# Patient Record
Sex: Female | Born: 1995 | Race: White | Hispanic: No | Marital: Married | Smoking: Never smoker
Health system: Western US, Academic
[De-identification: ages and names within clinical notes are randomized; demographics above are authoritative.]

## PROBLEM LIST (undated history)

## (undated) DIAGNOSIS — Z809 Family history of malignant neoplasm, unspecified: Secondary | ICD-10-CM

## (undated) HISTORY — DX: Family history of malignant neoplasm, unspecified: Z80.9

## (undated) MED ORDER — ESCITALOPRAM OXALATE 5 MG OR TABS
5.0000 mg | ORAL_TABLET | Freq: Every day | ORAL | 1 refills | Status: AC
Start: 2021-10-04 — End: ?

## (undated) MED ORDER — ESCITALOPRAM OXALATE 10 MG OR TABS
10.00 mg | ORAL_TABLET | Freq: Every day | ORAL | 0 refills | Status: AC
Start: 2022-04-12 — End: ?

---

## 2021-07-13 ENCOUNTER — Encounter (INDEPENDENT_AMBULATORY_CARE_PROVIDER_SITE_OTHER): Admitting: Family Medicine

## 2021-07-18 ENCOUNTER — Encounter (INDEPENDENT_AMBULATORY_CARE_PROVIDER_SITE_OTHER): Payer: Self-pay | Admitting: Hospital

## 2021-07-18 ENCOUNTER — Encounter (INDEPENDENT_AMBULATORY_CARE_PROVIDER_SITE_OTHER): Payer: Self-pay | Admitting: Family Medicine

## 2021-07-18 ENCOUNTER — Ambulatory Visit (INDEPENDENT_AMBULATORY_CARE_PROVIDER_SITE_OTHER): Admitting: Family Medicine

## 2021-07-18 VITALS — BP 112/74 | HR 61 | Temp 98.1°F | Resp 16 | Ht 63.0 in | Wt 100.6 lb

## 2021-07-18 MED ORDER — ESCITALOPRAM OXALATE 5 MG OR TABS
5.0000 mg | ORAL_TABLET | Freq: Every day | ORAL | 1 refills | Status: DC
Start: 2021-07-18 — End: 2021-08-10

## 2021-07-18 NOTE — Patient Instructions (Addendum)
 Sign release of records from providers for mammograms and ultrasounds, and MRI of brain. Take forms home and sent mychart copy.     Send a copy of your vaccine record via mychart.    Call the [facility] in 2 weeks if not informed of surgical consults.    Blood work today, if not Schedule 8 hour fasting blood work at [facility] or [facility] [employer]. Please drink plenty of water and bring a snack for afterwards.      Please allow for your Therapy referral two weeks to be authorized. Please call [facility] if you have not heard back. Can also check MyChart. You may contact the [facility] referrals department at [phone] with any questions.

## 2021-07-19 LAB — COMPREHENSIVE METABOLIC PANEL, BLOOD
A/G Ratio: 2.1 (ref 1.2–2.2)
ALT (SGPT): 9 [IU]/L (ref 0–32)
AST: 21 [IU]/L (ref 0–40)
Albumin: 5.1 g/dL — ABNORMAL HIGH (ref 3.9–5.0)
Alkaline Phos: 44 [IU]/L (ref 44–121)
BUN/Creatinine Ratio: 11 (ref 9–23)
BUN: 9 mg/dL (ref 6–20)
Bilirubin, Total: 0.6 mg/dL (ref 0.0–1.2)
Calcium: 10.7 mg/dL — ABNORMAL HIGH (ref 8.7–10.2)
Carbon Dioxide: 22 mmol/L (ref 20–29)
Chloride: 103 mmol/L (ref 96–106)
Creatinine: 0.83 mg/dL (ref 0.57–1.00)
EGFR: 100 mL/min/{1.73_m2} (ref >59)
Globulin, Total: 2.4 g/dL (ref 1.5–4.5)
Glucose: 91 mg/dL (ref 70–99)
Potassium: 4.5 mmol/L (ref 3.5–5.2)
Protein, Total, Serum: 7.5 g/dL (ref 6.0–8.5)
Sodium: 142 mmol/L (ref 134–144)

## 2021-07-19 LAB — CBC WITH DIFF, BLOOD
Baso (Absolute): 0.1 10*3/uL (ref 0.0–0.2)
Basos: 1 %
Eos (Absolute): 0.5 10*3/uL — ABNORMAL HIGH (ref 0.0–0.4)
Eos: 8 %
Hematocrit: 46.2 % (ref 34.0–46.6)
Hemoglobin: 15.6 g/dL (ref 11.1–15.9)
Immature Grans (Abs): 0 10*3/uL (ref 0.0–0.1)
Immature Granulocytes: 0 %
Lymphs (Absolute): 2 10*3/uL (ref 0.7–3.1)
Lymphs: 35 %
MCH: 30.2 pg (ref 26.6–33.0)
MCHC: 33.8 g/dL (ref 31.5–35.7)
MCV: 89 fL (ref 79–97)
Monocytes(Absolute): 0.3 10*3/uL (ref 0.1–0.9)
Monocytes: 5 %
Neutrophils (Absolute): 2.8 10*3/uL (ref 1.4–7.0)
Neutrophils: 51 %
Platelets: 192 10*3/uL (ref 150–450)
RBC: 5.17 x10E6/uL (ref 3.77–5.28)
RDW: 12.2 % (ref 11.7–15.4)
WBC: 5.5 10*3/uL (ref 3.4–10.8)

## 2021-07-19 LAB — LIPID(CHOL FRACT) PANEL, BLOOD
Cholesterol: 158 mg/dL (ref 100–199)
HDL Cholesterol: 77 mg/dL (ref >39)
LDL Chol CAL (NIH) - LABCORP: 68 mg/dL (ref 0–99)
Non-HDL Cholesterol: 81 mg/dL (ref 0–129)
Triglycerides: 65 mg/dL (ref 0–149)
VLDL Cholesterol CAL: 13 mg/dL (ref 5–40)

## 2021-07-19 LAB — THYROID CASCADE: TSH: 1.48 u[IU]/mL (ref 0.450–4.500)

## 2021-07-19 LAB — HEPATITIS C AB, BLOOD: Hep C Virus Ab: NONREACTIVE

## 2021-07-19 LAB — PROLACTIN, BLOOD: Prolactin: 8.1 ng/mL (ref 4.8–23.3)

## 2021-07-20 ENCOUNTER — Encounter (INDEPENDENT_AMBULATORY_CARE_PROVIDER_SITE_OTHER): Payer: Self-pay

## 2021-07-20 ENCOUNTER — Telehealth (INDEPENDENT_AMBULATORY_CARE_PROVIDER_SITE_OTHER): Payer: Self-pay | Admitting: Family Medicine

## 2021-07-24 ENCOUNTER — Ambulatory Visit: Admitting: Mental Health

## 2021-07-24 ENCOUNTER — Encounter (INDEPENDENT_AMBULATORY_CARE_PROVIDER_SITE_OTHER): Payer: Self-pay

## 2021-08-03 ENCOUNTER — Ambulatory Visit (INDEPENDENT_AMBULATORY_CARE_PROVIDER_SITE_OTHER): Admitting: Mental Health

## 2021-08-08 ENCOUNTER — Ambulatory Visit (INDEPENDENT_AMBULATORY_CARE_PROVIDER_SITE_OTHER): Admitting: Family Medicine

## 2021-08-08 ENCOUNTER — Encounter (INDEPENDENT_AMBULATORY_CARE_PROVIDER_SITE_OTHER): Payer: Self-pay | Admitting: Family Medicine

## 2021-08-08 VITALS — BP 118/77 | HR 61 | Temp 98.8°F | Resp 16 | Ht 63.0 in | Wt 99.0 lb

## 2021-08-09 LAB — PTH INTACT, BLOOD: PTH Intact: 31 pg/mL (ref 15–65)

## 2021-08-09 LAB — VITAMIN D, 25-OH TOTAL: Vitamin D, 25-Hydroxy: 42.7 ng/mL (ref 30.0–100.0)

## 2021-08-10 ENCOUNTER — Ambulatory Visit: Admitting: Mental Health

## 2021-08-10 ENCOUNTER — Other Ambulatory Visit (INDEPENDENT_AMBULATORY_CARE_PROVIDER_SITE_OTHER): Payer: Self-pay | Admitting: Family Medicine

## 2021-08-10 MED ORDER — ESCITALOPRAM OXALATE 5 MG OR TABS
5.0000 mg | ORAL_TABLET | Freq: Every day | ORAL | 1 refills | Status: DC
Start: 2021-08-10 — End: 2021-10-04

## 2021-08-14 ENCOUNTER — Telehealth (INDEPENDENT_AMBULATORY_CARE_PROVIDER_SITE_OTHER): Payer: Self-pay | Admitting: Family Medicine

## 2021-08-14 NOTE — Telephone Encounter (Signed)
 Referral note updated. I re faxed the referral to the providers office so they have the most up to date information.

## 2021-08-23 ENCOUNTER — Encounter (INDEPENDENT_AMBULATORY_CARE_PROVIDER_SITE_OTHER): Payer: Self-pay | Admitting: Family Medicine

## 2021-08-24 ENCOUNTER — Ambulatory Visit: Admitting: Mental Health

## 2021-08-25 ENCOUNTER — Encounter (INDEPENDENT_AMBULATORY_CARE_PROVIDER_SITE_OTHER): Payer: Self-pay | Admitting: Family Medicine

## 2021-08-30 ENCOUNTER — Encounter (INDEPENDENT_AMBULATORY_CARE_PROVIDER_SITE_OTHER): Payer: Self-pay | Admitting: Family Medicine

## 2021-08-30 ENCOUNTER — Ambulatory Visit: Admitting: Mental Health

## 2021-08-30 DIAGNOSIS — N63 Unspecified lump in unspecified breast: Secondary | ICD-10-CM

## 2021-08-30 DIAGNOSIS — Z803 Family history of malignant neoplasm of breast: Secondary | ICD-10-CM

## 2021-09-07 ENCOUNTER — Ambulatory Visit: Admitting: Mental Health

## 2021-09-14 ENCOUNTER — Telehealth (HOSPITAL_BASED_OUTPATIENT_CLINIC_OR_DEPARTMENT_OTHER): Payer: Self-pay

## 2021-09-14 NOTE — Telephone Encounter (Signed)
We tried to locate the provider being requested in the tircare portal and the provider did not come up.  I will contact the office to ask if they can confirm if they do in fact take tricare and how thy would be listed in the tricare system.

## 2021-09-14 NOTE — Telephone Encounter (Signed)
Received internal referral   UP:BDHDIX nodule, Family Hx of breast cancer in first degree relative      Indications: breast nodule and family history of breast cancer in 1st degree relative.    Imaging and reports included -- need images and slides    Will schedule in benign clinic with [facility]

## 2021-09-14 NOTE — Telephone Encounter (Signed)
Would I have to call their offices to confirm that Dr. Gracelyn Nurse accepts her insurance?

## 2021-09-18 NOTE — Telephone Encounter (Signed)
Breast MRI denied, however did not include the information regarding IHS's recommendation for MRI. Resubmitted MRI breast and asked referral department to include [place] and IHS letter that recommended MRI. Called referrals and it will be submitted today.

## 2021-09-18 NOTE — Telephone Encounter (Signed)
Called patient will call back to schedule apt. Has my direct number

## 2021-09-18 NOTE — Addendum Note (Signed)
Addended by: Leticia Penna on: 09/18/2021 03:51 PM     Modules accepted: Orders

## 2021-09-21 ENCOUNTER — Ambulatory Visit: Admitting: Mental Health

## 2021-09-21 ENCOUNTER — Encounter (HOSPITAL_BASED_OUTPATIENT_CLINIC_OR_DEPARTMENT_OTHER): Payer: Self-pay

## 2021-09-21 NOTE — Progress Notes (Signed)
Packed Wellness by [facility] Therapy Follow-Up Note      Date of Service: 09/21/2021  Duration: 58 minutes  Patient Location confirmed?: Yes    Patient acknowledged limits of confidentiality, mandated reporting requirements, and limitations of telehealth services.     Presenting Problem/Chief Complaint: stress     SESSION SUMMARY:   Patient is a 26 year old female.     Presentation/Mood:  Pt met with Th for telehealth session. Th checked in with the PT about how she is doing. Pt stated that she has been doing okay.. Th listened to the Pt talk about what has been going well. PT stated that she has still been making progress with the job but it has been slow and frustrating. Th talked with the Pt about how she has been with her mood. Pt stated thatshe is feeling less stressed and just trying to be in the present moment.  No current safety concerns: Patient denies DTS, DTO, SI, SIB, and does not present GD.     During this session Provider reviewed and incorporated established goals and interventions developed during intake.      PRELIMINARY TX GOALS AND INTERVENTIONS:    (Goals and Interventions related to each mental health diagnosis)      Goals:  -Working on changing her emotions and not feel sad as often.  -Working on having a more positive outlook on how the next few years will look like.  -Working on controlling her frustration with her husband for things he cannot control.    Patient Instructions   In the event of an emergency (worsening thoughts to harm self/others, inability to take care of yourself), please call 911 or go to the nearest emergency room.     If you need crisis counseling or referrals you may also call the [place] at [phone]; 24 hours, 7 days a week.      Additional Resources include:   [place] Suicide Hotline: [place] on [place]):1-601 161 7765, 8035899238     For non-urgent issues, you can message me directly via  My Chart or contact the Wellness Department at [phone].          FOLLOW UP   Return in about 1 week (around 09/28/2021).      Visit completed and signed by:    Harlow Mares, LCSW    Electronically Signed By: Harlow Mares LCSW [phone]

## 2021-09-21 NOTE — Patient Instructions (Signed)
In the event of an emergency (worsening thoughts to harm self/others, inability to take care of yourself), please call 911 or go to the nearest emergency room.    If you need crisis counseling or referrals you may also call the [place] Access and Crisis Line at [phone]; 24 hours, 7 days a week.      Additional Resources include:   [place] Suicide Hotline: 1-800-784-2433  National Alliance on Mental Illness ([place]):1-800-523-5933, 619-543-1434    For non-urgent issues, you can message me directly via My Chart or contact the Wellness Department at [phone].

## 2021-09-21 NOTE — Telephone Encounter (Signed)
Confirmed new patient consultation with patient  Has the patient been notified of:  Time of appt- yes  Date of appt- yes  Provider- yes  Location- yes  Visitor policy- yes    Understands Gloris Manchester is a female provider     Will request imaging through e health   Confirmation [phone]

## 2021-09-21 NOTE — Telephone Encounter (Signed)
Called patient left message to call back to confirm ins and member ID number

## 2021-09-22 ENCOUNTER — Other Ambulatory Visit: Payer: Self-pay

## 2021-09-26 ENCOUNTER — Encounter (INDEPENDENT_AMBULATORY_CARE_PROVIDER_SITE_OTHER): Payer: Self-pay | Admitting: Family Medicine

## 2021-09-26 NOTE — Telephone Encounter (Signed)
Secure chat sent to Dr.Damasco.

## 2021-09-26 NOTE — Telephone Encounter (Signed)
From: Debra Lee  To: Laurell Roof Damasco-Gutierrez, MD  Sent: 09/26/2021 10:47 AM PDT  Subject: repeat MRI orders    Good Morning,     I have gotten several calls from [facility] over the past few weeks about scheduling my breast MRI because they are getting new orders for it. I have one scheduled for the end of August so I do not need any more orders placed for breast MRIs.    Thank you,   Debra Lee

## 2021-09-28 ENCOUNTER — Ambulatory Visit (INDEPENDENT_AMBULATORY_CARE_PROVIDER_SITE_OTHER): Admitting: Mental Health

## 2021-10-04 ENCOUNTER — Telehealth: Admitting: Family Medicine

## 2021-10-04 ENCOUNTER — Telehealth (INDEPENDENT_AMBULATORY_CARE_PROVIDER_SITE_OTHER): Payer: Self-pay

## 2021-10-04 ENCOUNTER — Other Ambulatory Visit (INDEPENDENT_AMBULATORY_CARE_PROVIDER_SITE_OTHER): Payer: Self-pay | Admitting: Family Medicine

## 2021-10-04 ENCOUNTER — Encounter (INDEPENDENT_AMBULATORY_CARE_PROVIDER_SITE_OTHER): Payer: Self-pay | Admitting: Family Medicine

## 2021-10-04 DIAGNOSIS — F419 Anxiety disorder, unspecified: Secondary | ICD-10-CM

## 2021-10-04 MED ORDER — ESCITALOPRAM OXALATE 5 MG OR TABS
5.00 mg | ORAL_TABLET | Freq: Every day | ORAL | 3 refills | Status: DC
Start: 2021-10-04 — End: 2022-01-15

## 2021-10-04 NOTE — Progress Notes (Signed)
[facility] Tele-Medicine Note    Subjective   Debra Lee is a 26 year old female who presents to clinic via video. due to COVID-19 pandemic and federally declared state of public health emergency. Patient's identity verified and consent obtained.    HPI 10/04/21 video visit  Cc:  F/u on lexapro  lexapro 5 mg prescribed 08/10/21.   LMP not getting 2nd to Mirena 11/2018.  Seeing therapist .  Lexapro 47m   Gad phq      07/18/2021    10:47 AM 07/24/2021    11:20 AM 10/04/2021     1:37 PM   GAD 7   1. Feeling nervous, anxious or on edge 1 1 0   2. Not being able to stop or control worrying 2 1 0   3. Worrying too much about different things 2 1 0   4. Trouble relaxing 0 0 0   5. Being so restless that it is hard to sit still 0 0 0   6. Being easily annoyed or irritable 1 1 0   7. Feeling afraid as if something awful might happen 0 0 0   GAD7 Patient Total 6 4 0   If you checked off any problems, how difficult have these problems made it for you to do your job along with other people? Somewhat difficult  Not difficult at all          10/04/2021     1:35 PM 07/24/2021    11:18 AM 07/18/2021    10:47 AM   PHQ2 QUESTIONNAIRE   Little interest or pleasure in doing things 0 1 1   Feeling down, depressed, or hopeless 0 2 2   Trouble falling or staying asleep, or sleeping too much _0 Feeling tired or having little energy 0 2 1   Poor appetite or overeating 0 1 0   Feeling bad about yourself--or that you are a failure to have let yourself or your family down 0 2 2   Trouble concentrating on things, such as reading the newspaper or watching television 0 0 0   Moving or speaking so slowly that other people could have noticed.  Or the opposite--being so fidgety or restless that you have been moving around a lot more than usual 0 0 0   Thoughts that you would be better off dead, or of hurting yourself in some way 0 0 0   If you checked off any problems, how difficult have these problems made it for you to do your work, take care  of things at home, or get along with other people? Not difficult at all  Somewhat difficult   PHQ9 Patient Summary Score (calculated) _1 ?MRI of breasts appointment appointment in 2 weeks.     Saw pMedical sales representativeand to see breast surgeon in a few weeks.       Elevated calcium  Nl prolactin an dpth vit d 42.   Not consuming a lot of calcium.  Headaches decreased.  Getting 1 a week or every 2 weeks.  Could be coffee withdrawal.     Most Recent Labs:  Lab Results   Component Value Date    GLU 91 07/18/2021    CREAT 0.83 07/18/2021    EGFR 100 07/18/2021    TSH 1.480 07/18/2021     Lab Results   Component Value Date    AST 21 07/18/2021  ALT 9 07/18/2021     Lab Results   Component Value Date    HGB 15.6 07/18/2021    WBC 5.5 07/18/2021    PLT 192 07/18/2021     Lab Results   Component Value Date    LDLCHOLCALNI 68 07/18/2021    CHOL 158 07/18/2021    TRIG 65 07/18/2021    HDL 77 07/18/2021      Lab Results   Component Value Date    VITD25HYDROX 42.7 08/08/2021         REVIEW OF SYSTEMS      Review of Systems:    See HPI    Outpatient Medications Prior to Visit   Medication Sig Dispense Refill    cetirizine (ZYRTEC) 10 MG chewable tablet Take 1 tablet (10 mg) by mouth daily.      escitalopram (LEXAPRO) 5 MG tablet Take 1 tablet (5 mg) by mouth daily. 30 tablet 1     No facility-administered medications prior to visit.     Immunization History   Administered Date(s) Administered    Tdap 05/20/2017     Allergies   Allergen Reactions    Compazine [Prochlorperazine] Other     .     Pcn [Penicillins] Hives     Patient Active Problem List    Diagnosis Date Noted    Breast nodule 08/08/2021     Had ultrasound 07/2020 2 nodules at right breast.  5 oclock and 10 oclock      FHx: breast cancer in first degree relative 08/08/2021    Migraine without status migrainosus, not intractable, unspecified migraine type 08/08/2021    Anxiety 07/24/2021    MDD (major depressive disorder), recurrent episode, moderate (CMS-HCC)  07/24/2021     Past Medical History:   Diagnosis Date    Anxiety     Major depressive disorder, single episode     Migraine      Past Surgical History:   Procedure Laterality Date    WISDOM TOOTH EXTRACTION  2017     Social History     Socioeconomic History    Marital status: Married     Spouse name: Not on file    Number of children: Not on file    Years of education: Not on file    Highest education level: Not on file   Occupational History    Not on file   Tobacco Use    Smoking status: Never    Smokeless tobacco: Never   Substance and Sexual Activity    Alcohol use: Yes     Comment: socailly    Drug use: Never    Sexual activity: Yes     Partners: Male     Birth control/protection: I.U.D.   Other Topics Concern    Not on file   Social History Narrative    Not on file     Social Determinants of Health     Financial Resource Strain: Not on file   Food Insecurity: Not on file   Transportation Needs: Not on file   Physical Activity: Not on file   Stress: Not on file   Social Connections: Not on file   Intimate Partner Violence: Not on file   Housing Stability: Not on file     Family History   Problem Relation Name Age of Onset    Cancer Mother      Cancer Maternal Grandfather      Other Paternal Grandmother      Other Paternal  Grandfather      Hypertension Paternal Grandfather      Heart Disease Paternal Grandfather       Family Status   Relation Status    Mo (Not Specified)    MGFa (Not Specified)    PGMo (Not Specified)    PGFa (Not Specified)     Objective:  There were no vitals filed for this visit.  There is no height or weight on file to calculate BMI.    Wt Readings from Last 5 Encounters:   08/08/21 (!) 44.9 kg (99 lb)   07/18/21 (!) 45.6 kg (100 lb 9.6 oz)     Blood Pressure   08/08/21 118/77   07/18/21 112/74       PHYSICAL EXAMINATION      Physical Exam    GEN: The patient is well developed and well nourished, ambulatory, in no acute distress, not ill-appearing.  HEENT: normocephalic/atraumatic, anicteric  sclera, there is no evidence for cervical or supraclavicular lymphadenopathy bilaterally.  LUNGS: No cough noted throughout the interview. No dyspnea while talking and able to speak in full sentences. No nasal flaring, audible wheezing, or signs of respiratory distress.  EXTREMITIES: No edema of the lower extremities.  SKIN: No petechiae or rash of observed skin  NEURO: Alert & oriented x 3. No focal deficits noted.  PSYCHIATRIC: Good insight. Appropriate mood and affect    Labs:  Results for orders placed or performed in visit on 08/08/21   PTH Intact, Blood Lavender   Result Value Ref Range    PTH Intact 31 15 - 65 pg/mL   Vitamin D, 25-OH Total Yellow serum separator tube   Result Value Ref Range    Vitamin D, 25-Hydroxy 42.7 30.0 - 100.0 ng/mL     Imaging:  No results found.      ASSESSMENT AND PLAN        Serum calcium elevated    Migraine without status migrainosus, not intractable, unspecified migraine type    Anxiety and depression  -     escitalopram (LEXAPRO) 5 MG tablet        Patient Instructions   Continue lexapro  Schedule annual in 1 yr.  Follow up sooner if symptomatic    Health Maintenance   Topic Date Due    Universal HIV Screening  Never done    Cervical Cancer Screening  07/21/2020    COVID-19 Vaccine (5 - Mixed Product series) 02/17/2021    PHQ9 Depression Monitoring doc flowsheet  11/23/2021    Influenza (1) 12/20/2021    Lipid Screening  07/19/2026    Tetanus (3 - Td or Tdap) 05/21/2027    Polio Vaccine  Completed    IMM_Hep A Vaccine Series  Completed    HPV Vaccine <= 26 Yrs  Completed    Hepatitis C Screening  Completed    Meningococcal MCV4 Vaccine  Completed    Pneumococcal Vaccine  Aged Out       FOLLOW UP     Return in about 1 year (around 10/05/2022), or if symptoms develop, for annual and refill .    Future Appointments   Date Time Provider [place]   10/04/2021  1:40 PM Damasco-Gutierrez, Rose Fillers, MD [place] [facility]   10/05/2021 10:30 AM Ottis Stain MPCWS [facility]   11/14/2021  2:00 PM Jetty Peeks, NP [facility]       Maurilio Lovely, MD

## 2021-10-04 NOTE — Patient Instructions (Signed)
Continue lexapro  Schedule annual in 1 yr.  Follow up sooner if symptomatic

## 2021-10-04 NOTE — Telephone Encounter (Signed)
lvmtcb- please schedule Pd apt with Anderson, Matthew to refill medication

## 2021-10-05 ENCOUNTER — Ambulatory Visit: Admitting: Mental Health

## 2021-10-05 NOTE — Patient Instructions (Signed)
In the event of an emergency (worsening thoughts to harm self/others, inability to take care of yourself), please call 911 or go to the nearest emergency room.    If you need crisis counseling or referrals you may also call the [place] Access and Crisis Line at [phone]; 24 hours, 7 days a week.      Additional Resources include:   [place] Suicide Hotline: 1-800-784-2433  National Alliance on Mental Illness ([place]):1-800-523-5933, 619-543-1434    For non-urgent issues, you can message me directly via My Chart or contact the Wellness Department at [phone].

## 2021-10-05 NOTE — Progress Notes (Signed)
Packed Wellness by [facility] Therapy Follow-Up Note      Date of Service: 10/05/2021  Duration: 57 minutes  Patient Location confirmed?: Yes    Patient acknowledged limits of confidentiality, mandated reporting requirements, and limitations of telehealth services.     Presenting Problem/Chief Complaint: stress     SESSION SUMMARY:   Patient is a 26 year old female.     Presentation/Mood: Pt met with Th for telehealth session. Th checked in with the PT about how she is doing. Pt stated that she has been doing okay. Th listened to the Pt talk about what has been going well. PT stated that she has still been making progress with the job but it has been slow and frustrating. Th talked with the Pt about how she has been with her mood. Pt stated that she is feeling better about the transition to [place] next year. Pt stated that she wants to spend time with family as much as possible until then.   No current safety concerns: Patient denies DTS, DTO, SI, SIB, and does not present GD.     During this session Provider reviewed and incorporated established goals and interventions developed during intake.      PRELIMINARY TX GOALS AND INTERVENTIONS:    (Goals and Interventions related to each mental health diagnosis)     Goals:  -Working on changing her emotions and not feel sad as often.  -Working on having a more positive outlook on how the next few years will look like.  -Working on controlling her frustration with her husband for things he cannot control.    Patient Instructions   In the event of an emergency (worsening thoughts to harm self/others, inability to take care of yourself), please call 911 or go to the nearest emergency room.     If you need crisis counseling or referrals you may also call the [place] at [phone]; 24 hours, 7 days a week.      Additional Resources include:   [place] Suicide Hotline: [place] on [address] [place]):1-5642668681,  314-013-5867     For non-urgent issues, you can message me directly via My Chart or contact the Wellness Department at [phone].        FOLLOW UP   Return in about 2 weeks (around 10/19/2021).      Visit completed and signed by:    Harlow Mares, LCSW    Electronically Signed By: Harlow Mares LCSw [phone]

## 2021-11-01 ENCOUNTER — Ambulatory Visit: Admitting: Mental Health

## 2021-11-01 NOTE — Progress Notes (Signed)
Packed Wellness by [facility] Therapy Follow-Up Note      Date of Service: 11/01/2021  Duration: 59 minutes  Patient Location confirmed?: Yes    Patient acknowledged limits of confidentiality, mandated reporting requirements, and limitations of telehealth services.     Presenting Problem/Chief Complaint: stress     SESSION SUMMARY:   Patient is a 26 year old female.     Presentation/Mood: Pt met with Th for telehealth session. Th checked in with the PT about how she is doing. Pt stated that she has been doing okay. Th listened to the Pt talk about what has been going well. PT stated that she has a trip at the end of the month back home. Th talked with the Pt about how she has been with her mood. Pt stated that she is feeling better about the transition to [place] next year. Pt stated that she wants to spend time with family as much as possible until then.  No current safety concerns: Patient denies DTS, DTO, SI, SIB, and does not present GD.     During this session Provider reviewed and incorporated established goals and interventions developed during intake.      PRELIMINARY TX GOALS AND INTERVENTIONS:    (Goals and Interventions related to each mental health diagnosis)     Goals:  -Working on changing her emotions and not feel sad as often.  -Working on having a more positive outlook on how the next few years will look like.  -Working on controlling her frustration with her husband for things he cannot control.    Patient Instructions   In the event of an emergency (worsening thoughts to harm self/others, inability to take care of yourself), please call 911 or go to the nearest emergency room.     If you need crisis counseling or referrals you may also call the [place] at [phone]; 24 hours, 7 days a week.      Additional Resources include:   [place] Suicide Hotline: [place] on [address] [place]):1-(928)349-9605, (970)681-7521     For non-urgent  issues, you can message me directly via My Chart or contact the Wellness Department at [phone].        FOLLOW UP   Return in about 2 weeks (around 11/15/2021).      Visit completed and signed by:    Harlow Mares, LCSW    Electronically Signed By: Harlow Mares LCSW [phone]

## 2021-11-01 NOTE — Patient Instructions (Addendum)
In the event of an emergency (worsening thoughts to harm self/others, inability to take care of yourself), please call 911 or go to the nearest emergency room.    If you need crisis counseling or referrals you may also call the [place] Access and Crisis Line at [phone]; 24 hours, 7 days a week.      Additional Resources include:   [place] Suicide Hotline: 1-800-784-2433  National Alliance on Mental Illness ([place]):1-800-523-5933, 619-543-1434    For non-urgent issues, you can message me directly via My Chart or contact the Wellness Department at [phone].

## 2021-11-13 NOTE — Interdisciplinary (Unsigned)
New Patient: Referred by PCP for high risk screening    DX: Family history of breast cancer, benign mass in RIGHT breast, lifetime risk 29% ([facility] calculated 31%)    Outside imaging at [facility] 07/2021-8 mm benign mass in right breast at 2:00 position, seen on prior U/S in 2022. Also a hypoechoic nodule at 10:00 position which was recommended for short interval follow-up. Recommended Right Breast U/S in 6 months.     03/2020- Screening Mammogram- BIRADS 1      Symptom-Focused Nursing Assessment:    Patient presents to clinic to discuss risk reduction methods for her risk of breast cancer. Interested in prophylactic mastectomy.       History:  Menarche:  LMP:  Pregnancies: 0  HX Birth Control: Mirena IUD  HX HRT:  Breast Implants/Type:  Hx of Breast Biopsy/FNA:   Bra Size:  Smoking:  Alcohol:  Currently working?:  Ashkenazi Ancestry?  Genetic Testing? Mother had testing which was negative for mutations, patient had testing in 2022 through [facility] which was negative    Med/ Sx Hx:     Family History of Breast Cancer:  Mother -44  Maternal Aunt - 28  Maternal Cousin    Family History of Cancer:  Maternal Cousin- Ovarian  Maternal Grandfather- prostate cancer    Fall Risk    Fall Risk Interventions:   {Fall Risk Interventions:38420}     Education provided to patient/caregiver on fall risk factors and precautions.      Nutrition        Nutrition Interventions:  {Nutrition Interventions:38421}     Education provided on importance of early nutrition intervention for improved symptom management and outcomes, with in-person or telehealth visit and family involvement.     Wellbeing Screening    Screening      No questionnaires available.                                {Wellbeing Screening Interventions (Optional):38422}    {Oral Cancer-Directed Therapy (Optional):38423}    Pain        Pain interventions:    {Pain Interventions:38424}    Language: [place]       Nursing Education: Educated on plan of care, office contact, and AVS  using teach-back method.     Teaching outcomes:      {Teaching Outcomes:38425}     Patient/caregiver expressed understanding of plan of care and questions answered to patient satisfaction     {Education Materials Provided (Optional):38426}    Plan of care:

## 2021-11-14 ENCOUNTER — Ambulatory Visit: Payer: TRICARE Prime—HMO | Attending: Family Medicine | Admitting: Nurse Practitioner

## 2021-11-14 ENCOUNTER — Encounter (HOSPITAL_BASED_OUTPATIENT_CLINIC_OR_DEPARTMENT_OTHER): Payer: Self-pay | Admitting: Nurse Practitioner

## 2021-11-14 VITALS — BP 121/75 | HR 76 | Temp 96.5°F | Resp 16 | Ht 63.0 in | Wt 103.4 lb

## 2021-11-14 DIAGNOSIS — N63 Unspecified lump in unspecified breast: Secondary | ICD-10-CM | POA: Insufficient documentation

## 2021-11-14 DIAGNOSIS — Z803 Family history of malignant neoplasm of breast: Secondary | ICD-10-CM | POA: Insufficient documentation

## 2021-11-14 NOTE — Goals of Care (Signed)
Advance Care Planning     What gives the patient's life meaning?       Patient would be willing to endure aggressive medical therapies as long as they could still:       Who would make medical decisions for the patient if they are unable to make decisions for themselves?   Spouse- Mitzi Hansen    Based on above information I recommended the following:  reviewing [email or web address]    Total time spent face-to-face with patient and/or surrogate decision maker providing counseling related to advance care planning:   1 minutes

## 2021-11-14 NOTE — Patient Instructions (Signed)
[facility] PHONE LIST FOR PATIENTS  Hours of operation: Monday-Friday 8:00 - 5:00pm, Closed Holidays and Weekends      AFTER HOURS EMERGENCY NUMBER: [phone] Ask for On-Call Surgeon for Surgical Symptoms. As for On-Call Medical Oncologist for Medical Symptoms     Admin Assistant: Michelle Milo for Dr. Anne Wallace:   PH: [phone] FAX: [phone]    Admin Assistant: Vanessa Lopez for Nurse Practitioner Vince Genna: [phone]    Nurse Case Managers for Dr. Anne Wallace and Nurse Practitioner Vince Genna:   Coran Dipaola W., RN @ [phone]  Janeen W., RN @ [phone]  Bona R., RN @ [phone]    Social Worker: Phone: [phone]   [facility] support groups (open to any patients in [place] area:) [email or web address]/Support_Home.[email or web address]   American Cancer Society Breast [place] support groups: https://www.cancer.org/treatment/support-programs-and-services.html    Stage IV Zoom Breast Cancer Support group meets twice monthly; sign up with the following link: [email or web address]?trumbaEmbed=filterview%3Dcancerdefault%26template%3Dlist     Please note that due to recent changes in state laws, there may be times when your imaging reports are being released through Mychart before your provider team is able to review them. Please know we will review them by next business day and will contact you with results and recommendations.      VISIT THIS LINK FOR MORE INFORMATION REGARDING YOUR BREAST CANCER DIAGNOSIS http://[email or web address]/general_health/breast_care/guide_breast_ca_dx_tx.pdf   ___________________________________________________________________   Information Desk: [phone]   General information, directions, phone numbers, registration and other available services     Financial Counselors: [phone] at [facility] [phone] or [phone]     FOR  INFORMATION REGARDING ADVANCE DIRECTIVES PLEASE VISIT   [email or web address]/content/default/common/documents/PREPARE-Pamphlet-Flat-English.pdf  ______________________________________________________________________  Tell Us How We Did During Your Consultation/Follow Up Visit...  Your feedback goes a long way and makes a difference. If you would like to provide us feedback on how we did via telephone or  Email  E-mail: [email or web address] Phone: [phone]      Thank you for the opportunity to care for you during this time.

## 2021-11-16 ENCOUNTER — Ambulatory Visit (INDEPENDENT_AMBULATORY_CARE_PROVIDER_SITE_OTHER): Admitting: Mental Health

## 2021-11-16 NOTE — Progress Notes (Signed)
Medical Record #: [id number]   DOB: July 30, 1995    Reason for Visit  Chief Complaint   Patient presents with    Family History Of Cancer        History of Present Illness:     Debra Lee is a 26 year old female who is here for Family History Of Cancer    Pt presents to clinic for consult with her husband for support, to discuss her family hx of breast cancer, and recommendations for screening and prevention. She notes d/t her strong family hx, she has been thinking she needs to have bilateral mastectomies. They are a [place] family and next year will be stationed in [place], so also feel the surgery needs to happen soon since their healthcare there is unknown. She denies any symptoms of the breast including pain, erythema, nipple d/c, axillary fullness. She does note completing recent breast imaging which noted an 91m mass which appeared probably benign and she was recommended for 6 month f/u.     Her medical hx and risk factors as follows:  Menarche: 14  LMP: no periods related to IUD  Pregnancies: 0  HX Birth Control: Mirena IUD insitu  HX HRT: denies  Breast Implants/Type: denies  Hx of Breast Biopsy/FNA: 0  Smoking: denies  Alcohol: social  Currently working?: currently not working  AHarrison denies  Genetic Testing? Mother had testing which was negative for mutations, patient had testing in 2022 through [facility] which was negative. Maternal Cousin also had testing which was negative.      Med/ Sx Hx: migraines     Family History of Breast Cancer:  Mother -530- Invasive Ductal Carcinoma - ER+/Her 2+  Maternal Aunt - 44 - recurrence at 557- ER+, recurrence was borderline triple negative     Family History of Cancer:  Maternal Cousin- Ovarian - early 280s Maternal Grandfather- prostate cancer, head and neck cancer  Maternal cousin - sarcoma           Past Medical History:   Diagnosis Date    Anxiety     Major depressive disorder, single episode     Migraine          Past Surgical History:   Procedure  Laterality Date    WISDOM TOOTH EXTRACTION  2017         Family History   Problem Relation Name Age of Onset    Breast Cancer Mother      Cancer Mother      Cancer Maternal Grandfather      Other Paternal Grandmother      Other Paternal Grandfather      Hypertension Paternal Grandfather      Heart Disease Paternal Grandfather      Breast Cancer Maternal Aunt         Allergies  Allergies   Allergen Reactions    Compazine [Prochlorperazine] Other     .     Pcn [Penicillins] Hives    Reglan [Metoclopramide] Other     Muscle spasms         Current Outpatient Medications   Medication Sig Dispense Refill    cetirizine (ZYRTEC) 10 MG chewable tablet Take 1 tablet (10 mg) by mouth daily.      escitalopram (LEXAPRO) 5 MG tablet Take 1 tablet (5 mg) by mouth daily. 90 tablet 3     No current facility-administered medications for this visit.         Social History  Socioeconomic History    Marital status: Married   Tobacco Use    Smoking status: Never    Smokeless tobacco: Never   Substance and Sexual Activity    Alcohol use: Yes     Comment: socially    Drug use: Never    Sexual activity: Yes     Partners: Male     Birth control/protection: I.U.D.     Social History     Tobacco Use   Smoking Status Never   Smokeless Tobacco Never     Social History     Substance and Sexual Activity   Alcohol Use Yes    Comment: socially     Social History     Substance and Sexual Activity   Drug Use Never       Problem List  Patient Active Problem List   Diagnosis    Anxiety    MDD (major depressive disorder), recurrent episode, moderate (CMS-HCC)    Breast nodule    FHx: breast cancer in first degree relative    Migraine without status migrainosus, not intractable, unspecified migraine type       Review of Systems    Pertinent items are noted in HPI.    Physical Exam  Patient is a 26 year old female who appeared alert, cooperative, no distress  BP 121/75 (BP Location: Right arm, BP Patient Position: Sitting, BP cuff size: Regular)    Pulse 76   Temp 96.5 F (35.8 C) (Temporal)   Resp 16   Ht '5\' 3"'$  (1.6 m)   Wt (!) 46.9 kg (103 lb 6.4 oz)   SpO2 98%   BMI 18.32 kg/m   Body mass index is 18.32 kg/m.  General Appearance: healthy, alert, no distress, pleasant affect, cooperative.  Neck:  Neck supple. No adenopathy, thyroid symmetric, normal size.  Breast:  normal in size and symmetry, normal contour with no evidence of flattening or dimpling, skin normal, nipples everted without rashes or discharge, palpation negative for masses, with very dense and fibrocystic nodularity throughout. No noted lymphadenopathy.   Abdomen: Abdomen soft, non-tender. No masses or organomegaly. Bowel sounds normal.  Skin:  Skin color, texture, turgor normal. No rashes or lesions.    Medical Decision Making  Test Results:    Outside [facility] at [facility] 07[facility]- 8 mm benign mass in right breast at 2:00 position, seen on prior U/S in 2022. Also a hypoechoic nodule at 10:00 position which was recommended for short interval follow-up. Recommended Right Breast U/S in 6 months.      03/2020- Screening Mammogram- BIRADS 1     09/2021- Breast MRI - was told it was negative, no report to review    Impression:  Family history of breast cancer, benign mass in RIGHT breast, lifetime risk 29% ([facility] calculated 31%)     As noted, Debra Lee has a strong family hx of cancer including breast. Her previously calculated lifetime risk of developing breast cancer was found to be about 29%.   We discussed the NCCN guidelines for high risk which includes annual mammogram (normally starting at age 49), clinical exam and the consideration of breast MRI.     The details of MRI imaging were fully explained. MRI utilizes an injection of gadavist, an imaging agent, that is picked up by cancer cells differently than benign tissue. Therefore, a kinetic curve and a picture are obtained that see things differently than mammogram. It is very useful for detecting breast cancers in the high risk female.  There  is a very high false positive rate, however, when doing an MRI. This means that many women will be called back in for additional imaging - ultrasound, etc., and even biopsy of tissue that will not turn out to be cancer. However, in high risk women, there is up to a 15% incidence in finding new breast cancers that other screening did not find. And this can change surgical management. An MRI can be very time consuming. Some women become claustrophobic during it. And there may be follow up exams necessary. MRI can also slow done the time to get the patient's surgery scheduled.    Bilateral mastectomies, nipple sparing is also an option for prevention. This would lower her risk by over 90% for developing breast cancer. With the nipple sparing surgery the nipple is skinned off the breast leaving very little vascularization (in order to excise all ductal elements). Therefore, there is some risk of nipple loss. However, if the nipple survives there is excellent cosmesis due to the shape achieved with leaving the nipple/areolar complex.  We also discussed her options for reconstruction which includes expander/implants and tissue/flap based reconstruction.     She also notes a maternal cousin with ovarian cancer in her early 47s, and she reports no one has addressed her risk and recommendations. We discussed general options for screening and prevention, but she is advised to meet with gyn oncology to have an assessment and recommendation so she will understand what she should seek once she moves.     Tamoxifen for prevention was discussed with the patient. The details of the NSABP prevention trial showing a 49% reduction in breast cancer versus placebo with the use of tamoxifen was explained. For patients with atypia and LCIS, that reduction was over 70%. Tamoxifen also positively modulates the bones and so protects against osteopenia. Risks of tamoxifen include DVT, PE, stroke, cataracts, hot flashes, ovarian cysts,  change in periods, vaginal dryness, depression and in the post menopausal patient, uterine cancer. Therapy is recommended for 5 years. There is no data to suggest additional benefit for prevention beyond 5 years.     After discussion and answering both of their questions and concerns, at this point she will plan on completing her f/u 6 month [place] here at [facility] and she would like to f/u with our team at the time to discuss findings and to further answer her questions.     Time spent in this visit 50 minutes plus time spent to review medical hx and outside records.     The risks, benefits and alternatives of the planned course of care have been discussed with the patient and/or her legal representative, all questions have been answered and they agree to proceed.    Note Author: Jetty Peeks, NP

## 2021-11-22 ENCOUNTER — Ambulatory Visit: Admitting: Mental Health

## 2021-11-22 NOTE — Patient Instructions (Signed)
In the event of an emergency (worsening thoughts to harm self/others, inability to take care of yourself), please call 911 or go to the nearest emergency room.    If you need crisis counseling or referrals you may also call the [place] Access and Crisis Line at [phone]; 24 hours, 7 days a week.      Additional Resources include:   [place] Suicide Hotline: 1-800-784-2433  National Alliance on Mental Illness ([place]):1-800-523-5933, 619-543-1434    For non-urgent issues, you can message me directly via My Chart or contact the Wellness Department at [phone].

## 2021-11-22 NOTE — Progress Notes (Signed)
Packed Wellness by [facility] Therapy Follow-Up Note      Date of Service: 11/22/2021  Duration: 59 minutes  Patient Location confirmed?: Yes    Patient acknowledged limits of confidentiality, mandated reporting requirements, and limitations of telehealth services.     Presenting Problem/Chief Complaint: stress     SESSION SUMMARY:   Patient is a 26 year old female.     Presentation/Mood: Pt met with Th for telehealth session. Th checked in with the PT about how she is doing. Pt stated that she has been doing okay and finally has a start date for her job. Th listened to the Pt talk about what has been going well. PT stated that she had a good time back at home for the wedding. Th talked with the Pt about how she has been with her mood. Pt stated that she wants to spend time with family as much as possible until then.   No current safety concerns: Patient denies DTS, DTO, SI, SIB, and does not present GD.     During this session Provider reviewed and incorporated established goals and interventions developed during intake.      PRELIMINARY TX GOALS AND INTERVENTIONS:    (Goals and Interventions related to each mental health diagnosis)     Goals:  -Working on changing her emotions and not feel sad as often.  -Working on having a more positive outlook on how the next few years will look like.  -Working on controlling her frustration with her husband for things he cannot control.    Patient Instructions   In the event of an emergency (worsening thoughts to harm self/others, inability to take care of yourself), please call 911 or go to the nearest emergency room.     If you need crisis counseling or referrals you may also call the [facility] at [phone]; 24 hours, 7 days a week.      Additional Resources include:   [place] Suicide Hotline: [place] on [address] [place]):1-(506)364-3281, (717) 024-3122     For non-urgent issues, you can message me directly via  My Chart or contact the Wellness Department at [phone].        FOLLOW UP   Return in about 2 weeks (around 12/06/2021).      Visit completed and signed by:    Harlow Mares, LCSW    Electronically Signed By: Harlow Mares LCSW [phone]

## 2021-11-28 ENCOUNTER — Telehealth (HOSPITAL_BASED_OUTPATIENT_CLINIC_OR_DEPARTMENT_OTHER): Payer: Self-pay

## 2021-11-28 ENCOUNTER — Encounter (INDEPENDENT_AMBULATORY_CARE_PROVIDER_SITE_OTHER): Payer: Self-pay | Admitting: Family Medicine

## 2021-11-28 ENCOUNTER — Encounter (HOSPITAL_BASED_OUTPATIENT_CLINIC_OR_DEPARTMENT_OTHER): Payer: Self-pay | Admitting: Nurse Practitioner

## 2021-11-28 DIAGNOSIS — N63 Unspecified lump in unspecified breast: Secondary | ICD-10-CM

## 2021-11-28 DIAGNOSIS — Z803 Family history of malignant neoplasm of breast: Secondary | ICD-10-CM

## 2021-11-28 NOTE — Telephone Encounter (Signed)
From: Wyona Almas  To: Jetty Peeks, NP  Sent: 11/28/2021 10:49 AM PDT  Subject: referral for GYN    Hello,     I just called GYN ONC to schedule an appointment. Due to me being BRCA negative and not having a personal history of cancer they said I should be seeing general GYN instead. I was wondering if the referral could be changed?     Thank you!  Wyona Almas

## 2021-11-28 NOTE — Telephone Encounter (Addendum)
Pending New Gyn order and authorization from [place]. Pt messaged referring provider.

## 2021-11-28 NOTE — Telephone Encounter (Signed)
Internal / GYN referral received. Please schedule with R1-R4, Dr Luz Lex or any available provider at any location.    For: Family Hx of cancers. Requesting consultation to discuss risks, screening/prevention.    Please check for authorization on services needed.

## 2021-11-28 NOTE — Telephone Encounter (Signed)
Received VM from pt asking for call back to help schedule follow up visit with Debra Lee.     Called pt back. She preferred same day appt as [place] which is currently scheduled for 1/10 and prefers [place] location. Let pt know Sharee Pimple is in clinic either 1/9 or 1/23 that month. Pt will call imaging and see if she can get [place] rescheduled to one of those days and call me back.

## 2021-11-28 NOTE — Telephone Encounter (Signed)
Pt called back and was not able to get [facility] scheduling that day. Let pt know I will assist and call her back.     Called breast imaging and got her right breast US scheduled for 1/23 @ 10am @ [facility]. Will schedule to see Vince @ 10:45am. Called pt back and confirmed both appts. Thanked for the assistance.

## 2021-11-28 NOTE — Telephone Encounter (Signed)
Indications: pt has very strong family hx of cancers including cousin with ovarian cancer in early 65s. would like to discuss her risk and recommendations for screening/prevention. pt is militiary family and needs consult befor being relocated.    Spoke with patient no positive BRCA no personal history of cancer, routed pt to General GYN for assistance.

## 2021-11-29 NOTE — Telephone Encounter (Signed)
From: Debra Lee  To: Rose Fillers Damasco-Gutierrez, MD  Sent: 11/28/2021 10:51 AM PDT  Subject: breast specialist referral    Hello,    A couple of weeks ago I had my appointment with Charna Archer, NP out of [facility]. I have recently gotten a call from the plastic surgeons office I had an appointment with stating that the referral was for a breast specialist in their practice. I am concerned that the referral was not written to cover the appointment I already had with the breast specialists at [facility]. Can this be double checked?    Thank you,   Debra Lee

## 2021-11-29 NOTE — Telephone Encounter (Signed)
Secure chat sent to Dr.Damasco.

## 2021-12-01 ENCOUNTER — Telehealth (HOSPITAL_BASED_OUTPATIENT_CLINIC_OR_DEPARTMENT_OTHER): Payer: Self-pay

## 2021-12-01 NOTE — Telephone Encounter (Signed)
Per Dr. Reynolds Bowl, [place] for patient to be seen by NP Selinda Eon for discussion and counseling.

## 2021-12-01 NOTE — Telephone Encounter (Signed)
Per triage provider okay to offer new consult.    Michele Srilasak NP [facility]    We have successfully contacted the patient and arranged for a new appointment at the Gynecological Oncology department. To ensure a smooth visit, kindly make sure to bring your insurance card and a photo ID as instructed.

## 2021-12-01 NOTE — Addendum Note (Signed)
Addended by: Rhodia Albright on: 12/01/2021 02:33 PM     Modules accepted: Orders

## 2021-12-01 NOTE — Telephone Encounter (Signed)
Retroactive referral request sent. Please advise if any assistance is needed.

## 2021-12-04 NOTE — Telephone Encounter (Signed)
Pending retro auth with medical group

## 2021-12-05 ENCOUNTER — Ambulatory Visit: Admitting: Mental Health

## 2021-12-05 NOTE — Patient Instructions (Signed)
In the event of an emergency (worsening thoughts to harm self/others, inability to take care of yourself), please call 911 or go to the nearest emergency room.    If you need crisis counseling or referrals you may also call the [place] Access and Crisis Line at [phone]; 24 hours, 7 days a week.      Additional Resources include:   [place] Suicide Hotline: 1-800-784-2433  National Alliance on Mental Illness ([place]):1-800-523-5933, 619-543-1434    For non-urgent issues, you can message me directly via My Chart or contact the Wellness Department at [phone].

## 2021-12-05 NOTE — Telephone Encounter (Signed)
Pt has appt with Gyn Onc scheduled.

## 2021-12-05 NOTE — Progress Notes (Signed)
Packed Wellness by [facility] Therapy Follow-Up Note      Date of Service: 12/05/2021  Duration: 55 minutes  Patient Location confirmed?: Yes    Patient acknowledged limits of confidentiality, mandated reporting requirements, and limitations of telehealth services.     Presenting Problem/Chief Complaint: stress     SESSION SUMMARY:   Patient is a 26 year old female.     Presentation/Mood: Pt met with Th for telehealth session. Th checked in with the PT about how she is doing. Pt stated that she is finally starting her new job on Monday next week. Th listened to the Pt talk about what has been going well. PT stated that she found out that they will most likely been moving in January. Th talked with the Pt about how she has been with her mood.   No current safety concerns: Patient denies DTS, DTO, SI, SIB, and does not present GD.     During this session Provider reviewed and incorporated established goals and interventions developed during intake.      PRELIMINARY TX GOALS AND INTERVENTIONS:    (Goals and Interventions related to each mental health diagnosis)   Diagnoses and all orders for this visit:    MDD (major depressive disorder), recurrent episode, moderate (CMS-HCC)    Anxiety      Goals:  -Working on changing her emotions and not feel sad as often.  -Working on having a more positive outlook on how the next few years will look like.  -Working on controlling her frustration with her husband for things he cannot control.    Patient Instructions   In the event of an emergency (worsening thoughts to harm self/others, inability to take care of yourself), please call 911 or go to the nearest emergency room.     If you need crisis counseling or referrals you may also call the [facility] at [phone]; 24 hours, 7 days a week.      Additional Resources include:   [place] Suicide Hotline: [place] on [address] [place]):1-778-496-6914, 873-538-7629     For  non-urgent issues, you can message me directly via My Chart or contact the Wellness Department at [phone].        FOLLOW UP   Return in about 2 weeks (around 12/19/2021).      Visit completed and signed by:    Harlow Mares, LCSW    Electronically Signed By: Harlow Mares LCSW [phone]

## 2022-01-15 ENCOUNTER — Encounter (INDEPENDENT_AMBULATORY_CARE_PROVIDER_SITE_OTHER): Payer: Self-pay | Admitting: Hospital

## 2022-01-15 ENCOUNTER — Telehealth: Admitting: Family Medicine

## 2022-01-15 MED ORDER — ESCITALOPRAM OXALATE 10 MG OR TABS
10.0000 mg | ORAL_TABLET | Freq: Every day | ORAL | 0 refills | Status: DC
Start: 2022-01-15 — End: 2022-04-12

## 2022-01-15 NOTE — Patient Instructions (Addendum)
Start increase dose of lexapro 10 mg.    Follow up if side effects or if need med adjustments and refill.    Consider repeating blood test to check calcium.    Call the [facility] to schedule for blood work at one of [facility] or go to [email or web address] to schedule appointment at [facility].

## 2022-01-15 NOTE — Progress Notes (Signed)
[facility] Tele-Medicine Note    Subjective   Debra Lee is a 26 year old female who presents to clinic via video. due to COVID-19 pandemic and federally declared state of public health emergency. Patient's identity verified and consent obtained.    HPI 01/15/22 video visit  Patient seen 10/04/21 for f/u on lexapro   LMP not getting 2nd to Mirena 11/2018.  Seeing therapist .  Lexapro 5mg  #90 x 3 filled 10/04/21  F/u 1 yr for annual.  Iud 11/2018 mirena for 7     To transfer 12/2018 in a few months.      To see gyn 02/2022  Risk for ovarian [place] and to get pap with gyn  Need gad for today    Depression  Unable to schedule therapy 2nd to new job and only available in evening  Working at [employer].              07/18/2021    10:47 AM 07/24/2021    11:20 AM 10/04/2021     1:37 PM 01/16/2022    10:26 AM   GAD 7   1. Feeling nervous, anxious or on edge 1 1 0 1   2. Not being able to stop or control worrying 2 1 0 2   3. Worrying too much about different things 2 1 0 1   4. Trouble relaxing 0 0 0 0   5. Being so restless that it is hard to sit still 0 0 0 0   6. Being easily annoyed or irritable 1 1 0 1   7. Feeling afraid as if something awful might happen 0 0 0 0   GAD7 Patient Total 6 4 0 5   If you checked off any problems, how difficult have these problems made it for you to do your job along with other people? Somewhat difficult  Not difficult at all Somewhat difficult           07/18/2021    10:47 AM 07/24/2021    11:18 AM 10/04/2021     1:35 PM 01/15/2022     4:21 PM 01/16/2022    10:25 AM   [place] PHQ9 DEPRESSION QUESTIONNAIRE   Interest 1 1 0 1 1   Depressed 2 2 0 1 1   Sleep 2 1 1  0 0   Energy 1 2 0 0 0   Appetite 0 1 0 0 0   Failure 2 2 0 2 2   Concentration 0 0 0 0 0   Movement 0 0 0 0 0   Suicide 0 0 0 0 0   Summary(Calculated) 8 9 1 4 4    Functional Somewhat difficult -- Not difficult at all Somewhat difficult Somewhat difficult          REVIEW OF SYSTEMS      Review of Systems:    See  HPI    Outpatient Medications Prior to Visit   Medication Sig Dispense Refill    cetirizine (ZYRTEC) 10 MG chewable tablet Take 1 tablet (10 mg) by mouth daily.      escitalopram (LEXAPRO) 5 MG tablet Take 1 tablet (5 mg) by mouth daily. 90 tablet 3     No facility-administered medications prior to visit.     Immunization History   Administered Date(s) Administered    Tdap 05/20/2017     Allergies   Allergen Reactions    Compazine [Prochlorperazine] Other     .  Pcn [Penicillins] Hives    Reglan [Metoclopramide] Other     Muscle spasms     Patient Active Problem List    Diagnosis Date Noted    Breast nodule 08/08/2021     Had ultrasound 07/2020 2 nodules at right breast.  5 oclock and 10 oclock      FHx: breast cancer in first degree relative 08/08/2021    Migraine without status migrainosus, not intractable, unspecified migraine type 08/08/2021    Anxiety 07/24/2021    MDD (major depressive disorder), recurrent episode, moderate (CMS-HCC) 07/24/2021     Past Medical History:   Diagnosis Date    Anxiety     Major depressive disorder, single episode     Migraine      Past Surgical History:   Procedure Laterality Date    WISDOM TOOTH EXTRACTION  2017     Social History     Socioeconomic History    Marital status: Married     Spouse name: Not on file    Number of children: Not on file    Years of education: Not on file    Highest education level: Not on file   Occupational History    Not on file   Tobacco Use    Smoking status: Never    Smokeless tobacco: Never   Substance and Sexual Activity    Alcohol use: Yes     Comment: socially    Drug use: Never    Sexual activity: Yes     Partners: Male     Birth control/protection: I.U.D.   Other Topics Concern    Not on file   Social History Narrative    Not on file     Social Determinants of Health     Financial Resource Strain: Not on file   Food Insecurity: Not on file   Transportation Needs: Not on file   Physical Activity: Not on file   Stress: Not on file   Social  Connections: Not on file   Intimate Partner Violence: Low Risk  (01/08/2022)    [place] IPV     Have you ever been emotionally or physically abused by your partner or someone important to you?: Not on file     Within the last year have you been hit, slapped, kicked or otherwise physically hurt by someone?: Not on file     Since you've been pregnant, have you been slapped, kicked or otherwise physically hurt by someone?: Not on file     Within the last year has anyone forced you to have sexual activities?: Not on file     Are you afraid of your spouse or partner you listed above?: Not on file   Housing Stability: Not on file     Family History   Problem Relation Name Age of Onset    Breast Cancer Mother      Cancer Mother      Cancer M Grandfather      Other P Grandmother      Other P Grandfather      Hypertension P Grandfather      Heart Disease P Grandfather      Breast Cancer M Aunt       Family Status   Relation Status    Mo (Not Specified)    MGFa (Not Specified)    PGMo (Not Specified)    PGFa (Not Specified)    MAunt (Not Specified)     Objective:  There were no vitals filed  for this visit.  There is no height or weight on file to calculate BMI.    Wt Readings from Last 5 Encounters:   11/14/21 (!) 46.9 kg (103 lb 6.4 oz)   08/08/21 (!) 44.9 kg (99 lb)   07/18/21 (!) 45.6 kg (100 lb 9.6 oz)     Blood Pressure   11/14/21 121/75   08/08/21 118/77   07/18/21 112/74       PHYSICAL EXAMINATION      Physical Exam    GEN: The patient is well developed and well nourished, ambulatory, in no acute distress, not ill-appearing.  HEENT: normocephalic/atraumatic, anicteric sclera, there is no evidence for cervical or supraclavicular lymphadenopathy bilaterally.  LUNGS: No cough noted throughout the interview. No dyspnea while talking and able to speak in full sentences. No nasal flaring, audible wheezing, or signs of respiratory distress.  EXTREMITIES: No edema of the lower extremities.  SKIN: No petechiae or rash of observed  skin  NEURO: Alert & oriented x 3. No focal deficits noted.  PSYCHIATRIC: Good insight. Appropriate mood and affect    Labs:  Results for orders placed or performed in visit on 08/08/21   PTH Intact, Blood Lavender   Result Value Ref Range    PTH Intact 31 15 - 65 pg/mL   Vitamin D, 25-OH Total Yellow serum separator tube   Result Value Ref Range    Vitamin D, 25-Hydroxy 42.7 30.0 - 100.0 ng/mL     Imaging:  No results found.      ASSESSMENT AND PLAN        Depression, unspecified depression type  -     escitalopram (LEXAPRO) 10 MG tablet    Serum calcium elevated  -     Comprehensive Metabolic Panel        Patient Instructions   Start increase dose of lexapro 10 mg.    Follow up if side effects or if need med adjustments and refill.    Consider repeating blood test to check calcium.    Call the [facility] to schedule for blood work at one of [facility] or go to [email or web address] to schedule appointment at [facility].       Health Maintenance   Topic Date Due    Universal HIV Screening  Never done    Cervical Cancer Screening  07/21/2020    COVID-19 Vaccine (6 - 2023-24 season) 10/20/2021    PHQ9 Depression Monitoring doc flowsheet  05/17/2022    Lipid Screening  07/19/2026    Tetanus (4 - Td or Tdap) 12/14/2031    Shingles Vaccine (1 of 2) 03/07/2045    Polio Vaccine  Completed    IMM_Hep A Vaccine Series  Completed    HPV Vaccine <= 26 Yrs  Completed    Hepatitis C Screening  Completed    Influenza  Completed    Meningococcal MCV4 Vaccine  Completed    Pneumococcal Vaccine  Aged Out       FOLLOW UP     Return in about 4 weeks (around 02/12/2022), or if symptoms worsen or fail to improve, for follow up on medication. .    Future Appointments   Date Time Provider Department Center   01/15/2022  4:20 PM Damasco-Gutierrez, Laurell Roof, MD MPCMM CC [facility] MM   02/23/2022  9:30 AM Stephannie Li, NP MUC Onc MUC   03/13/2022 10:00 AM [place] BREAST ULTRASOUND LWC MAMMO [facility]   03/13/2022 10:45 AM Mable Paris, NP  MON Onc MON  Maurilio Lovely, MD

## 2022-01-26 ENCOUNTER — Telehealth (INDEPENDENT_AMBULATORY_CARE_PROVIDER_SITE_OTHER): Payer: Self-pay | Admitting: Family Medicine

## 2022-01-26 NOTE — Telephone Encounter (Signed)
Sent patient a message.

## 2022-01-26 NOTE — Telephone Encounter (Signed)
[facility] Provider Message    Has there been a previous Telephone/MyChart encounter in the last month for same issue?: No    Caller informed that with few exceptions a visit is preferred and encouraged to schedule a visit: Yes    Who's calling?: pt.    The call is regarding:     Pt requesting pap smear order before appt. Says she needs asap because she is having to go to [place] soon for husbands job Psychologist, educational ). Has an appt on 12/11    Which provider is this relevant to?: DR. DAMASCO-GUTIERREZ.  Is this provider in-office today and still seeing patients?: Yes    Ok with a response from a Engineer, site?: Yes    Caller informed that message should be addressed by a provider within 72 business hours?: Yes    If caller is requesting a more urgent response, ROUTE HIGH PRIORITY and inform patient that [facility] will make every effort to address within 24 business hours.    Please ask the caller how they would like to be contacted?: Phone Call. Best call back number: [phone]    Future Appointments   Date Time Provider Department Center   01/29/2022  4:20 PM Damasco-Gutierrez, Laurell Roof, MD MPCMM CC [facility] MM   02/23/2022  9:30 AM Stephannie Li, NP MUC Onc MUC   03/13/2022 10:00 AM [place] BREAST ULTRASOUND LWC MAMMO [facility]   03/13/2022 10:45 AM Mable Paris, NP MON Onc MON       [place]

## 2022-01-29 ENCOUNTER — Ambulatory Visit (INDEPENDENT_AMBULATORY_CARE_PROVIDER_SITE_OTHER): Admitting: Family Medicine

## 2022-01-29 ENCOUNTER — Encounter (INDEPENDENT_AMBULATORY_CARE_PROVIDER_SITE_OTHER): Payer: Self-pay | Admitting: Family Medicine

## 2022-01-29 ENCOUNTER — Encounter (INDEPENDENT_AMBULATORY_CARE_PROVIDER_SITE_OTHER): Payer: Self-pay

## 2022-01-29 VITALS — BP 119/72 | HR 60 | Temp 97.5°F | Resp 16 | Ht 63.0 in | Wt 110.6 lb

## 2022-01-29 NOTE — Progress Notes (Signed)
SUBJECTIVE: Debra Lee is a 27 year old female who presents to clinic for Patient Complaint (Requesting pap smear /Fill out overseas forms )      HPI 01/29/2022  Debra Lee presents to clinic today for [place] ppw and pap smear and annual eval.  Patient has migraines, depression, anxiety, breast nodule and fhx breast [place].      Patient had annual/establish care exam 07/18/21  Patient moved from [place] 07/03/21.  Married 3.5 yrs G0P0 Mirena replaced 11/2018 x 7 yrs. No menses but spotting 2 to 3 x/yr. Pap 2022/6 nl per pt but office that did pap is closed and she doesn't have records. 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     Migraines   No dizziness now.  Occasional migraines 1x/mo. Hospitalized when in high school.  occ spotty vision before headache.  Taking ibuprofen or tylenol for headache occurring later in the day and sleeps it off.  Can be related to sleeping too much or if not enough water or if not sleeping enough.    Patient tried multiple medications, however they were all ineffective.   Hospitalized at least 8 times in 2011-2015 for severe symptoms.    Migraines 1x/month. Controlled with OTC medication.     Labs   07/18/21 prol 8.1 tshnl chol158 hdl77 ldl 68 ca10.7 H/10.2 alb5.1H/5 eos0.5H/0.4 hepC neg     Family History   Patient with strong fhx breast [place]  Mother infiltrating ductal [place] age 25 estrogen +, HER 2+  maunt  infiltrating ductal [place] age 69 borderline triple neg, borderline HER2 age 69, +MS   Cousin with MS.       Plastic surgery/ Breast Specialist   Patient requesting double mastectomy and reconstruction 2nd to family history  Her genetic tests were neg but she was told 33% risk of breast [place] 2nd to family history.  Last mammogram 03/2021  [place], [place] Dense breast  07/2020 [place], [place] breast care specialist who tx mother and aunt  Had ultrasound 07/2020 2 nodules at right breast.  5 oclock and 10 oclock     12/01/21 requested:  Referrals: please do retroactive  referral for patient for Barnet Pall, NP with [facility]. Her appointment was 11/14/2021.      08/08/21 breast US Oval circumscribed hypoechoic nodule 8 x 3 x 6 mm 2 o'clock position 3 cm  from the nipple     10/16/21 breast MRI neg.     Patient decided to proceed with double mastectomy due to not having enough time.     Review of Systems -   Constitutional: negative.  Eyes: negative.  Ears, Nose, Mouth, Throat: negative.  CV: negative.  Resp: negative.  GI: negative.  GU: negative.  Musculoskeletal: negative.  Neuro: negative.  Psych: anxiety, but controlled with Lexapro.  Endo: negative.  Heme/Lymphatic: negative.  Allergy/Immun: seasonal allergies, controlled with Zyrtec.  GYN: negative.     Health Maintenance:  Health Maintenance Topics with due status: Overdue       Topic Date Due    Universal HIV Screening Never done    Cervical Cancer Screening 07/21/2020    COVID-19 Vaccine 10/20/2021     Health Maintenance Topics with due status: Not Due       Topic Last Completion Date    Shingles Vaccine 09/17/2006    Lipid Screening 07/18/2021    Tetanus 12/13/2021    PHQ9 Depression Monitoring doc flowsheet 01/16/2022     Health Maintenance Topics  with due status: Completed       Topic Last Completion Date    Polio Vaccine 07/18/2000    HPV Vaccine <= 26 Yrs 01/26/2011    Meningococcal MCV4 Vaccine 07/16/2012    IMM_Hep A Vaccine Series 08/24/2013    Hepatitis C Screening 07/18/2021    Influenza 12/13/2021     Health Maintenance Topics with due status: Aged Out       Topic Date Due    Pneumococcal Vaccine Aged Out        PAST MEDICAL AND SURGICAL HISTORY:    has a past surgical history that includes Wisdom tooth extraction (2017).  Past Medical History:   Diagnosis Date    Anxiety     Major depressive disorder, single episode     Migraine        CURRENT MEDICATIONS:    Current Outpatient Medications:     cetirizine (ZYRTEC) 10 MG chewable tablet, Take 1 tablet (10 mg) by mouth daily., Disp: , Rfl:     escitalopram (LEXAPRO)  10 MG tablet, Take 1 tablet (10 mg) by mouth daily., Disp: 90 tablet, Rfl: 0     ALLERGIES:  Metoclopramide, Penicillins, and Prochlorperazine    FAMILY HISTORY:  family history includes Breast Cancer in her m aunt and mother; Cancer in her m grandfather and mother; Heart Disease in her p grandfather; Hypertension in her p grandfather; Other in her p grandfather and p grandmother.    SOCIAL HISTORY:    reports that she has never smoked. She has never used smokeless tobacco. She reports current alcohol use. She reports that she does not use drugs.     OBJECTIVE:   BP 119/72 (BP Location: Left arm, BP Patient Position: Sitting, BP cuff size: Regular)   Pulse 60   Temp 97.5 F (36.4 C)   Resp 16   Ht _0  (1.6 m)   Wt 50.2 kg (110 lb 9.6 oz)   SpO2 98%   BMI 19.59 kg/m   Wt Readings from Last 5 Encounters:   01/29/22 50.2 kg (110 lb 9.6 oz)   11/14/21 (!) 46.9 kg (103 lb 6.4 oz)   08/08/21 (!) 44.9 kg (99 lb)   07/18/21 (!) 45.6 kg (100 lb 9.6 oz)     Blood Pressure   01/29/22 119/72   11/14/21 121/75   08/08/21 118/77   07/18/21 112/74       LABS:  Results for orders placed or performed in visit on 08/08/21   PTH Intact, Blood Lavender   Result Value Ref Range    PTH Intact 31 15 - 65 pg/mL   Vitamin D, 25-OH Total Yellow serum separator tube   Result Value Ref Range    Vitamin D, 25-Hydroxy 42.7 30.0 - 100.0 ng/mL       IMAGING:  No results found.    PHYSICAL EXAMINATION:  Physical Exam  GENERAL:  well-appearing, well-developed, no acute distress.  Hygiene: Well-groomed. Alert oriented and pleasant   HEENT:  HEAD:  Normocephalic, atraumatic EYES:  PERRLA.  Sclera:  anicteric. EARS: canals normal. tympanic membranes normal bilaterally, no gross hearing deficits.  EOM:  intact.  NOSE: septum midline.   NECK:  no lymphadenopathy.  No JVD.  Normal ROM.  Thyroid:  Unremarkable, no palpable thyroid abnormalities   LUNGS:  CTA bilaterally, no wheezing/rhonchi/rales. Breath sounds clear bilaterally no respiratory  distress.    HEART: normal S1S2, no S3 or S4. No murmurs, gallops, rubs.  PMI:  normal.  Rate:  normal.  Rhythm:  regular.    ABDOMEN:  Normoactive bowel sounds, no bruits, no distention, soft and non-tender.  Guarding:  absent.  Hernia:  none.  Inguinal nodes:  not enlarged.  Masses:  none.  Rebound tenderness:  absent.  Tenderness:  none.    EXTREMITIES:  No clubbing, edema, or cyanosis, no tremors, skin warm to touch and intact.  GYN: no masses, discharge, or tenderness.  SKIN:  Color:  good.  General:  warm, moist.  Suspicious Lesions:  none.    NEUROLOGICAL:  Cerebellar function  WNL. CN's II-XII grossly intact. Normal gait.  normal strength bilaterally.   MUSCULOSKELETAL:  Cervical spines:  normal.  L-S spines:  normal.  Lower extremity joints:  normal.  Upper extremity joints:  normal.    PSYCHOLOGY:  Affect:  normal.  Mood:  pleasant. Orientation to person, place and time.      ASSESSMENT AND PLAN:  Screening for cervical cancer  -     Pap IG, rfx HPV all pth    Migraine without status migrainosus, not intractable, unspecified migraine type    FHx: breast cancer in first degree relative    Anxiety  Comments:  Controlled with medication.    Breast nodule  Overview:  Had ultrasound 07/2020 2 nodules at right breast.  5 oclock and 10 oclock      MDD (major depressive disorder), recurrent episode, moderate (CMS-HCC)  Comments:  Controlled with medication.        There are no Patient Instructions on file for this visit.    FOLLOW UP  Return if symptoms worsen or fail to improve.    Roselind Messier, MD  Note prepared by: Rhodia Albright, Scribe

## 2022-01-29 NOTE — Progress Notes (Deleted)
SUBJECTIVE: Debra Lee is a 26 year old female who presents to clinic for No chief complaint on file.      HPI 01/29/22 visit  Debra Lee presents to clinic today  For military forms and annual and pap if needed.  Patient has migraines, depression, anxiety, breast nodule and fhx breast [place].     Patient had annual/establish care exam 07/18/21  Patient moved from [place] 07/03/21.  Married 3.5 yrs G0P0 Mirena replaced 11/2018 x 7 yrs. No menses but spotting 2 to 3 x/yr. Pap 2022/6 nl per pt. 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 07/18/21 prol 8.1 tshnl chol158 hdl77 ldl 68 ca10.7 H/10.2 alb5.1H/5 eos0.5H/0.4 hepC neg   Family History   Patient with strong fhx breast [place]  Mother infiltrating ductal [place] age 79 estrogen +, HER 2+  maunt  infiltrating ductal [place] age 60 borderline triple neg, borderline HER2 age 26, +MS   Cousin with MS.       Plastic surgery/ Breast Specialist   Patient requesting double mastectomy and reconstruction 2nd to family history  Her genetic tests were neg but she was told 33% risk of breast [place] 2nd to family history.  Last mammogram 03/2021  [place], [place] Dense breast  07/2020 [place], [place] breast care specialist who tx mother and aunt  Had ultrasound 07/2020 2 nodules at right breast.  5 oclock and 10 oclock    12/01/21 requested:  Referrals: please do retroactive referral for patient for Barnet Pall, NP with [place]. Her appointment was 11/14/2021.     08/08/21 breast US Oval circumscribed hypoechoic nodule 8 x 3 x 6 mm 2 o'clock position 3 cm  from the nipple    10/16/21 breast MRI neg.   Health Maintenance  Health Maintenance Topics with due status: Overdue       Topic Date Due    Universal HIV Screening Never done    Cervical Cancer Screening 07/21/2020    COVID-19 Vaccine 10/20/2021     Health Maintenance Topics with due status: Not Due       Topic Last Completion Date    Shingles Vaccine 09/17/2006    Lipid Screening 07/18/2021    Tetanus 12/13/2021    PHQ9  Depression Monitoring doc flowsheet 01/16/2022     Health Maintenance Topics with due status: Completed       Topic Last Completion Date    Polio Vaccine 07/18/2000    HPV Vaccine <= 26 Yrs 01/26/2011    Meningococcal MCV4 Vaccine 07/16/2012    IMM_Hep A Vaccine Series 08/24/2013    Hepatitis C Screening 07/18/2021    Influenza 12/13/2021     Health Maintenance Topics with due status: Aged Out       Topic Date Due    Pneumococcal Vaccine Aged Out        Review of Systems:   See HPI    PAST MEDICAL AND SURGICAL HISTORY:    has a past surgical history that includes Wisdom tooth extraction (2017).  Past Medical History:   Diagnosis Date    Anxiety     Major depressive disorder, single episode     Migraine        CURRENT MEDICATIONS:    Current Outpatient Medications:     cetirizine (ZYRTEC) 10 MG chewable tablet, Take 1 tablet (10 mg) by mouth daily., Disp: , Rfl:     escitalopram (LEXAPRO) 10 MG tablet, Take 1 tablet (10 mg) by mouth daily., Disp: 90  tablet, Rfl: 0     ALLERGIES:  Compazine [prochlorperazine], Pcn [penicillins], and Reglan [metoclopramide]    FAMILY HISTORY:  family history includes Breast Cancer in her m aunt and mother; Cancer in her m grandfather and mother; Heart Disease in her p grandfather; Hypertension in her p grandfather; Other in her p grandfather and p grandmother.    SOCIAL HISTORY:    reports that she has never smoked. She has never used smokeless tobacco. She reports current alcohol use. She reports that she does not use drugs.     OBJECTIVE:   There were no vitals taken for this visit.  Wt Readings from Last 5 Encounters:   11/14/21 (!) 46.9 kg (103 lb 6.4 oz)   08/08/21 (!) 44.9 kg (99 lb)   07/18/21 (!) 45.6 kg (100 lb 9.6 oz)     Blood Pressure   11/14/21 121/75   08/08/21 118/77   07/18/21 112/74     The ASCVD Risk score (Arnett DK, et al., 2019) failed to calculate for the following reasons:    The 2019 ASCVD risk score is only valid for ages 67 to 34    LABS:  Results for orders  placed or performed in visit on 08/08/21   PTH Intact, Blood Lavender   Result Value Ref Range    PTH Intact 31 15 - 65 pg/mL   Vitamin D, 25-OH Total Yellow serum separator tube   Result Value Ref Range    Vitamin D, 25-Hydroxy 42.7 30.0 - 100.0 ng/mL       IMAGING:  No results found.    PHYSICAL EXAMINATION:  Physical Exam  GENERAL:  well-appearing, well-developed, no acute distress.  Hygiene: Well-groomed. Alert oriented and pleasant   HEENT:  HEAD:  Normocephalic, atraumatic EYES:  PERRLA.  Sclera:  anicteric. EARS: canals normal. tympanic membranes normal bilaterally, no gross hearing deficits.  EOM:  intact.  NOSE: septum midline.   NECK:  no lymphadenopathy.  No JVD.  Normal ROM.  Thyroid:  Unremarkable, no palpable thyroid abnormalities   LUNGS:  CTA bilaterally, no wheezing/rhonchi/rales. Breath sounds clear bilaterally no respiratory distress.    HEART: normal S1S2, no S3 or S4. No murmurs, gallops, rubs.  PMI:  normal.  Rate:  normal.  Rhythm:  regular.    ABDOMEN:  Normoactive bowel sounds, no bruits, no distention, soft and non-tender.  Guarding:  absent.  Hernia:  none.  Inguinal nodes:  not enlarged.  Masses:  none.  Rebound tenderness:  absent.  Tenderness:  none.    EXTREMITIES:  No clubbing, edema, or cyanosis, no tremors, skin warm to touch and intact.    SKIN:  Color:  good.  General:  warm, moist.  Suspicious Lesions:  none.    NEUROLOGICAL:  Cerebellar function  WNL. CN's II-XII grossly intact. Normal gait.  normal strength bilaterally.   MUSCULOSKELETAL:  Cervical spines:  normal.  L-S spines:  normal.  Lower extremity joints:  normal.  Upper extremity joints:  normal.    PSYCHOLOGY:  Affect:  normal.  Mood:  pleasant. Orientation to person, place and time.      ASSESSMENT AND PLAN:  There are no diagnoses linked to this encounter.    There are no Patient Instructions on file for this visit.    FOLLOW UP  No follow-ups on file.    Roselind Messier, MD

## 2022-01-29 NOTE — Patient Instructions (Signed)
Pap done today.  Results in 1 week  Military form filled out.  Immunizations printed.  All up to date.   Requested change in gyn referral for earlier appointment.

## 2022-01-31 ENCOUNTER — Encounter (INDEPENDENT_AMBULATORY_CARE_PROVIDER_SITE_OTHER): Payer: Self-pay | Admitting: Family Medicine

## 2022-01-31 NOTE — Telephone Encounter (Signed)
Scheduled appointment on 12/14

## 2022-02-01 ENCOUNTER — Encounter (INDEPENDENT_AMBULATORY_CARE_PROVIDER_SITE_OTHER): Payer: Self-pay | Admitting: Medical

## 2022-02-01 ENCOUNTER — Telehealth: Admitting: Family Medicine

## 2022-02-01 ENCOUNTER — Encounter (INDEPENDENT_AMBULATORY_CARE_PROVIDER_SITE_OTHER): Payer: Self-pay | Admitting: Family Medicine

## 2022-02-01 ENCOUNTER — Encounter (INDEPENDENT_AMBULATORY_CARE_PROVIDER_SITE_OTHER): Payer: Self-pay | Admitting: Hospital

## 2022-02-01 LAB — PAP WITH RFX TO HR HPV ASCUS,SIL,AGUS (APTIMA): PAP .1: 0

## 2022-02-01 NOTE — Patient Instructions (Signed)
Additional forms filled out.      If changes needed, schedule another appointment and send forms.

## 2022-02-01 NOTE — Progress Notes (Signed)
[facility] Tele-Medicine Note    Subjective   Debra Lee is a 26 year old female who presents to clinic via telephone. Patient was unable to access technology for video. due to COVID-19 pandemic and federally declared state of public health emergency. Patient's identity verified and consent obtained.    HPI 02/01/22 video visit  Patient seen 01/29/22 for well woman exam and [place] paperwork.  PAP still pending.   No menses since Mirena 11/2018.  Spotting 2 to 3 x /yr.    Patient had additional forms to fill out for [employer] transfer.         07/18/2021    10:47 AM 07/24/2021    11:20 AM 10/04/2021     1:37 PM 01/16/2022    10:26 AM   GAD 7   1. Feeling nervous, anxious or on edge 1 1 0 1   2. Not being able to stop or control worrying 2 1 0 2   3. Worrying too much about different things 2 1 0 1   4. Trouble relaxing 0 0 0 0   5. Being so restless that it is hard to sit still 0 0 0 0   6. Being easily annoyed or irritable 1 1 0 1   7. Feeling afraid as if something awful might happen 0 0 0 0   GAD7 Patient Total 6 4 0 5   If you checked off any problems, how difficult have these problems made it for you to do your job along with other people? Somewhat difficult  Not difficult at all Somewhat difficult         07/18/2021    10:47 AM 07/24/2021    11:18 AM 10/04/2021     1:35 PM 01/15/2022     4:21 PM 01/16/2022    10:25 AM   [place] PHQ9 DEPRESSION QUESTIONNAIRE   Interest 1 1 0 1 1   Depressed 2 2 0 1 1   Sleep _0 0 0   Energy 1 2 0 0 0   Appetite 0 1 0 0 0   Failure 2 2 0 2 2   Concentration 0 0 0 0 0   Movement 0 0 0 0 0   Suicide 0 0 0 0 0   Summary(Calculated) _1 Functional Somewhat difficult -- Not difficult at all Somewhat difficult Somewhat difficult              REVIEW OF SYSTEMS      Review of Systems:    See HPI    Outpatient Medications Prior to Visit   Medication Sig Dispense Refill    cetirizine (ZYRTEC) 10 MG chewable tablet Take 1 tablet (10 mg) by mouth daily.      escitalopram  (LEXAPRO) 10 MG tablet Take 1 tablet (10 mg) by mouth daily. 90 tablet 0     No facility-administered medications prior to visit.     Immunization History   Administered Date(s) Administered    (Chicken Pox) Varicella Vaccine 03/11/1996, 09/17/2006    COVID-19 (International Approved 2 Dose Only) 05/06/2019, 06/04/2019, 03/23/2020, 12/23/2020    COVID-19 (Moderna) Red Cap >= 12 Years 03/22/2020    HPV Unspecified Formulation 04/22/2010, 07/05/2010, 01/26/2011    Hep-A Vaccine, Unspecified 04/22/2010, 08/24/2013    Hepatitis B, Unspecified 04/22/95, 05/01/1995, 09/05/1995    Influenza Vaccine >=6 Months 12/13/2021    MMR 03/11/1996, 07/18/2000    Meningococcal MCV4P 04/22/2010, 07/16/2012  Polio, Unspecified 05/01/1995, 07/02/1995, 03/11/1996, 07/18/2000    Tdap 05/20/2017, 12/13/2021    Tetanus/Diptheria Vaccine 09/17/2006     Allergies   Allergen Reactions    Metoclopramide Other and Unspecified     Muscle spasms    Penicillins Hives and Rash    Prochlorperazine Other     .     Patient Active Problem List    Diagnosis Date Noted    Breast nodule 08/08/2021     Had ultrasound 07/2020 2 nodules at right breast.  5 oclock and 10 oclock      FHx: breast cancer in first degree relative 08/08/2021    Migraine without status migrainosus, not intractable, unspecified migraine type 08/08/2021    Anxiety 07/24/2021    MDD (major depressive disorder), recurrent episode, moderate (CMS-HCC) 07/24/2021     Past Medical History:   Diagnosis Date    Anxiety     Major depressive disorder, single episode     Migraine      Past Surgical History:   Procedure Laterality Date    WISDOM TOOTH EXTRACTION  2017     Social History     Socioeconomic History    Marital status: Married     Spouse name: Not on file    Number of children: Not on file    Years of education: Not on file    Highest education level: Not on file   Occupational History    Not on file   Tobacco Use    Smoking status: Never    Smokeless tobacco: Never   Substance and  Sexual Activity    Alcohol use: Yes     Comment: socially    Drug use: Never    Sexual activity: Yes     Partners: Male     Birth control/protection: I.U.D.   Other Topics Concern    Not on file   Social History Narrative    Not on file     Social Determinants of Health     Financial Resource Strain: Not on file   Food Insecurity: Not on file   Transportation Needs: Not on file   Physical Activity: Not on file   Stress: Not on file   Social Connections: Not on file   Intimate Partner Violence: Low Risk  (01/08/2022)    [place] IPV     Have you ever been emotionally or physically abused by your partner or someone important to you?: Not on file     Within the last year have you been hit, slapped, kicked or otherwise physically hurt by someone?: Not on file     Since you've been pregnant, have you been slapped, kicked or otherwise physically hurt by someone?: Not on file     Within the last year has anyone forced you to have sexual activities?: Not on file     Are you afraid of your spouse or partner you listed above?: Not on file   Housing Stability: Not on file     Family History   Problem Relation Name Age of Onset    Breast Cancer Mother      Cancer Mother      Cancer M Grandfather      Other P Grandmother      Other P Grandfather      Hypertension P Grandfather      Heart Disease P Grandfather      Breast Cancer M Aunt       Family Status   Relation  Status    Mo (Not Specified)    MGFa (Not Specified)    PGMo (Not Specified)    PGFa (Not Specified)    MAunt (Not Specified)     Objective:  There were no vitals filed for this visit.  There is no height or weight on file to calculate BMI.    Wt Readings from Last 5 Encounters:   01/29/22 50.2 kg (110 lb 9.6 oz)   11/14/21 (!) 46.9 kg (103 lb 6.4 oz)   08/08/21 (!) 44.9 kg (99 lb)   07/18/21 (!) 45.6 kg (100 lb 9.6 oz)     Blood Pressure   01/29/22 119/72   11/14/21 121/75   08/08/21 118/77   07/18/21 112/74       PHYSICAL EXAMINATION      Physical Exam    Exam limited by  telephone encounter  LUNGS: No cough noted throughout the interview. No dyspnea while talking and able to speak in full sentences.   NEURO: Alert & oriented x 3.   PSYCHIATRIC: Good insight.      Labs:  Results for orders placed or performed in visit on 08/08/21   PTH Intact, Blood Lavender   Result Value Ref Range    PTH Intact 31 15 - 65 pg/mL   Vitamin D, 25-OH Total Yellow serum separator tube   Result Value Ref Range    Vitamin D, 25-Hydroxy 42.7 30.0 - 100.0 ng/mL     Imaging:  No results found.      ASSESSMENT AND PLAN    Spent 11 minutes discussing and filling out form.     Migraine without status migrainosus, not intractable, unspecified migraine type    Anxiety and depression        Patient Instructions   Additional forms filled out.      If changes needed, schedule another appointment and send forms.      Health Maintenance   Topic Date Due    Universal HIV Screening  Never done    Cervical Cancer Screening  07/21/2020    COVID-19 Vaccine (6 - 2023-24 season) 10/20/2021    PHQ9 Depression Monitoring doc flowsheet  05/17/2022    Lipid Screening  07/19/2026    Tetanus (4 - Td or Tdap) 12/14/2031    Shingles Vaccine (1 of 2) 03/07/2045    Polio Vaccine  Completed    IMM_Hep A Vaccine Series  Completed    HPV Vaccine <= 26 Yrs  Completed    Hepatitis C Screening  Completed    Influenza  Completed    Meningococcal MCV4 Vaccine  Completed    Pneumococcal Vaccine  Aged Out       FOLLOW UP     Return if symptoms worsen or fail to improve.    Future Appointments   Date Time Provider [place]   02/01/2022  8:00 AM Damasco-Gutierrez, Rose Fillers, MD MPCMM CC [facility] MM   02/23/2022  9:30 AM Derrek Gu, NP MUC Onc MUC   03/13/2022 10:00 AM [place] BREAST ULTRASOUND [facility] MAMMO [facility]   03/13/2022 10:45 AM Jetty Peeks, NP MON Onc MON       Maurilio Lovely, MD

## 2022-02-02 ENCOUNTER — Telehealth (INDEPENDENT_AMBULATORY_CARE_PROVIDER_SITE_OTHER): Payer: Self-pay | Admitting: Family Medicine

## 2022-02-02 NOTE — Telephone Encounter (Signed)
[facility] Staff Callback Request    Has there been a previous Telephone/MyChart encounter in the last month for same issue?: No    Who's calling?: Patient.    The call is regarding:     Requesting document that was uploaded to MyChart re uploaded due to her not being able to print out more than the first page. She suggested uploading to "Documents". Or if letter can be sent via PocketDoc    Caller informed that message should be addressed within 72 business hours?: Yes    If caller is requesting a more urgent response, ROUTE HIGH PRIORITY and inform patient that [facility] will make every effort to address within 24 business hours.    Please ask the caller how they would like to be contacted?: Phone Call. Best call back number: [phone]      April Rothschild-Stokes

## 2022-02-07 NOTE — Telephone Encounter (Signed)
Pt's husband, Dalyla Chui, came to the [facility] location to pick up the forms.

## 2022-02-07 NOTE — Telephone Encounter (Signed)
[facility] Encounter Note    Previous Telephone/MyChart encounter created for same issue?  Yes (do not create new encounter - use open encounter)    Please describe the action taken by yourself in detail:  pt unable to print paperwork at home, her husband, Sanyiah Kanzler, will come to office to pick up the paper work.     This is just documentation in the patient chart, no further action is required.    My phone extension, if further information is needed: [phone]    Theressa Stamps

## 2022-02-21 ENCOUNTER — Telehealth (HOSPITAL_BASED_OUTPATIENT_CLINIC_OR_DEPARTMENT_OTHER): Payer: Self-pay

## 2022-02-21 NOTE — Telephone Encounter (Signed)
Phone call received regarding canceled appointment with [place] on 02/23/22.    Pt would like a call back to discuss and can be reached at [phone]

## 2022-02-21 NOTE — Telephone Encounter (Signed)
Pt to call [employer] to obtain a copy of denial

## 2022-02-21 NOTE — Telephone Encounter (Signed)
Patient called and said that the referral was actually approved and she asked [employer] to fax it over to [place] but they advised her that they cannot send it to a different fax number. Advised her that I would contact New patient coordinator and let her know but appt cannot be held since we do not have approval yet.

## 2022-02-21 NOTE — Telephone Encounter (Signed)
We have successfully contacted the patient and arranged for a new appointment at the Gynecological Oncology department. To ensure a smooth visit, kindly make sure to bring your insurance card and a photo ID as instructed.     During the appointment, a vaginal examination will be conducted. This is a routine examination that allows our medical professionals to assess any potential health concerns. Rest assured that our team will provide a safe and comfortable environment throughout the examination.

## 2022-02-23 ENCOUNTER — Ambulatory Visit (HOSPITAL_BASED_OUTPATIENT_CLINIC_OR_DEPARTMENT_OTHER): Payer: TRICARE Prime—HMO | Admitting: Nurse Practitioner

## 2022-02-28 ENCOUNTER — Other Ambulatory Visit (INDEPENDENT_AMBULATORY_CARE_PROVIDER_SITE_OTHER): Payer: TRICARE Prime—HMO

## 2022-03-02 ENCOUNTER — Other Ambulatory Visit: Payer: Self-pay

## 2022-03-09 ENCOUNTER — Telehealth (HOSPITAL_BASED_OUTPATIENT_CLINIC_OR_DEPARTMENT_OTHER): Payer: Self-pay

## 2022-03-09 NOTE — Telephone Encounter (Signed)
Patient is requesting a call back. She is requesting paperwork to be filled out to opt out of having surgery. Stated husband is in the [employer] and they will be moving [employer] would need this paperwork. Patient can be reached at [phone]

## 2022-03-09 NOTE — Telephone Encounter (Signed)
Letter drafted up and signed by NP.  Sent to [place] via email.

## 2022-03-09 NOTE — Telephone Encounter (Signed)
Called patient to get more information.  She needs a letter from our team stating that prophylactic surgery is not medically necessary or recommended as part of her medical examination that is required by the [employer] before moving to [place] on orders.  She has appt scheduled next week with [place] but wants to submit this information today if possible, as they are moving on 1/30.

## 2022-03-12 NOTE — Interdisciplinary (Unsigned)
Follow up for high risk screening     DX: Family history of breast cancer, benign mass in RIGHT breast, lifetime risk 29% (Myriad calculated 31%)  Genetics; Negative ( 2022 Myriad)     Family History of Breast Cancer:  Mother -25 - Invasive Ductal Carcinoma - ER+/Her 2+  Maternal Aunt - 44 - recurrence at 26 - ER+, recurrence was borderline triple negative       Symptom-Focused Nursing Assessment:  Last visit 10/2021   Unaccompanied. Results from repeat ultrasound are pending. Denies any breast redness, skin warmth, nipple symptoms or pain.   Scheduled to see Gyn/Onc NP Srilasak 03/15/2022    Relocating to [place]-  letter sent stating that prophylactic surgery is not medically necessary or recommended as part of her medical examination. Leaves in 1 month      Fall Risk     Fall Risk Interventions:   No fall risk identified; no interventions at this time     Education provided to patient/caregiver on fall risk factors and precautions.      Nutrition     Nutrition Interventions:  MST score < 2, referral not indicated, no additional questions or concerns     Education provided on importance of early nutrition intervention for improved symptom management and outcomes, with in-person or telehealth visit and family involvement.      Wellbeing Screening     Screening  Wellbeing Screening 2.0            No questionnaires available.                                         Wellbeing Screening Interventions : No issues identified; no interventions at this time     Patient not on active oral cancer-directed therapy at this time     Pain      Pain interventions:    No intervention needed at this time     Language: [place]     Nursing Education: Educated on plan of care, office contact, and AVS using teach-back method.     Teaching outcomes:      Patient/caregiver verbalized understanding     Patient/caregiver expressed understanding of plan of care and questions answered to patient satisfaction     Education Materials Provided :  Materials sent to patient through [place] of care:   -Annual MRI, starting at age 66 add annual mammogram. Ok to alternate imaging every 6 months   -RTC Prn or when stateside

## 2022-03-12 NOTE — Patient Instructions (Addendum)
[place] PHONE LIST FOR PATIENTS  Hours of operation: Monday-Friday 8:00 - 5:00pm, Closed Holidays and Weekends   AFTER HOURS EMERGENCY NUMBER: [phone] Ask for On-Call Surgeon for Surgical Symptoms. As for [facility] Oncologist for Medical Symptoms     Admin Assistant [facility]) for Dr. Lawernce Keas: [facility]: [phone] FAX: [phone]  Admin Assistant (Candis Musa) for Nurse Practitioner Charna Archer: [phone], [place]: [phone]     Nurse Case Managers Diona Browner., RN @ [phone] ,  Chinita Pester., RN @ [phone], Gaynelle Arabian., RN [phone]    +++++++++++++++++++++++++++++++++++++++++++++++++++++++++++++++++++++++++++++++++++++++  If you would like to provide [place] feedback on how we did, you can contact the Patient Experience 'We Listen' Department via telephone, Email, or by mail.    E-mail: [email or web address] Phone: [phone]     Annual MRI, starting at age 77 add annual mammogram. Ok to alternate imaging every 6 months   Thank you for the opportunity to care for you during this time.

## 2022-03-13 ENCOUNTER — Inpatient Hospital Stay (INDEPENDENT_AMBULATORY_CARE_PROVIDER_SITE_OTHER): Admit: 2022-03-13 | Discharge: 2022-03-13 | Disposition: A | Discharge status: Home Health | Payer: TRICARE Prime—HMO

## 2022-03-13 ENCOUNTER — Ambulatory Visit (HOSPITAL_BASED_OUTPATIENT_CLINIC_OR_DEPARTMENT_OTHER): Payer: TRICARE Prime—HMO

## 2022-03-13 ENCOUNTER — Ambulatory Visit: Payer: TRICARE Prime—HMO | Attending: Nurse Practitioner | Admitting: Nurse Practitioner

## 2022-03-13 VITALS — BP 132/69 | HR 81 | Temp 98.0°F | Resp 16 | Ht 63.0 in

## 2022-03-13 DIAGNOSIS — Z803 Family history of malignant neoplasm of breast: Secondary | ICD-10-CM | POA: Insufficient documentation

## 2022-03-13 DIAGNOSIS — N6312 Unspecified lump in the right breast, upper inner quadrant: Secondary | ICD-10-CM

## 2022-03-15 ENCOUNTER — Encounter (HOSPITAL_BASED_OUTPATIENT_CLINIC_OR_DEPARTMENT_OTHER): Payer: Self-pay | Admitting: Hospital

## 2022-03-15 ENCOUNTER — Ambulatory Visit: Payer: TRICARE Prime—HMO | Attending: Nurse Practitioner | Admitting: Nurse Practitioner

## 2022-03-15 ENCOUNTER — Encounter (HOSPITAL_BASED_OUTPATIENT_CLINIC_OR_DEPARTMENT_OTHER): Payer: Self-pay | Admitting: Nurse Practitioner

## 2022-03-15 ENCOUNTER — Encounter (INDEPENDENT_AMBULATORY_CARE_PROVIDER_SITE_OTHER): Payer: Self-pay | Admitting: Family Medicine

## 2022-03-15 VITALS — BP 114/59 | HR 64 | Temp 97.1°F | Resp 16 | Ht 64.0 in | Wt 106.0 lb

## 2022-03-15 DIAGNOSIS — Z803 Family history of malignant neoplasm of breast: Secondary | ICD-10-CM | POA: Insufficient documentation

## 2022-03-15 DIAGNOSIS — Z809 Family history of malignant neoplasm, unspecified: Secondary | ICD-10-CM | POA: Insufficient documentation

## 2022-03-15 NOTE — Patient Instructions (Signed)
Referral placed to cancer genetics  Recommend pelvic ultrasound annually       We strive to provide a very good patient experience from compassion and caring to courtesy. Please tell us about your experience by completing your patient satisfaction survey that you may receive in the mail. We want to hear from you and are dedicated to gaining your loyalty.       Thank you for allowing [facility] to participate in your healthcare!               [place] Phone List for Patients  [facility] Monday-Friday 8am - 5pm (closed holidays and weekends)  Infusion Center Monday-Friday 7am - 7:30pm & Saturday-Sunday 8am - 4:30pm        AFTER HOURS EMERGENCY NUMBER [phone]) [phone]   **Ask for the Gynecology Oncology Physician On-Call        Nurse Practitioner: Katharine Look, NP  Phone [phone] Fax [phone] Pager [phone]  **For medication refills please have your pharmacy fax a request.     Administrative Assistant: Tenna Delaine  Phone [phone] Option #6 Fax [phone]  **Please call before coming in to pick up forms or letters.    Social Worker: Mellody Drown LCSW  Phone [phone]  **Call if you have questions about practical, emotional, support, or resource needs.     [place] [phone]  **Call to schedule, cancel, or reschedule clinic appointments for Dr. Remo Lipps [place].    Infusion Center [phone]   **Call to schedule, cancel, or reschedule chemotherapy appointments.    Radiation Oncology [phone]  **Call to schedule, cancel, or reschedule radiation appointments.    Clinical Trials Office [phone]

## 2022-03-15 NOTE — Progress Notes (Signed)
Gynecologic Oncology New Patient Visit    Referring MD: Jetty Peeks  Referral indication: Family history of cancer    ID: Debra Lee is a 27 year old female with a family history of cancer - no personal history of cancer who is being seen in clinic for consultation.    All family members as detailed below did have Myriad genetic testing and results were negative for mutation.    I do not have copies of genetic testing results for today's visit.    Family History of Breast Cancer:  Mother -77 - Invasive Ductal Carcinoma - ER+/Her 2+  Maternal Aunt - 44 - recurrence at 83 - ER+, recurrence was borderline triple negative     Family History of Cancer:  Maternal Cousin- low grade malignant potential tumor of ovary age 6 yo - no recurrence  Maternal Grandfather- prostate cancer, head and neck cancer  Maternal cousin - sarcoma          Subjective: The patient looks and feels good. She is ambulating, tolerating oral intake, voiding  without problems. No nausea, no vomiting. No blood in urine or stool. No change in bowel or bladder habits. No unintentional weight loss. Declines; abdominal bloating, pelvic pain and vaginal bleeding.     Currently have Mirena IUD: occasional spotting few times a year.    The patient's husband is in the [employer] as such she will be relocating to [place] in 2 months.     Review of Systems: abnormals in bold   Constitutional:negative, no fatigue  CV: negative, no chest pain.   Resp: negative, no SOB.   GI: negative, no abdominal bloating or distension, N/V or anorexia. No BRBPR. See HPI  GU: negative, no hematuria, no change in bladder habits.   Musculoskeletal: negative, no leg pain or back pain.   Endo: negative, no weight gain or weight loss.   Heme/Lymphatic: negative, no sweats, no fevers or chills.   GYN: See HPI  Psych: + depression   Skin: negative, no new lesions  Neuro: negative, no HAs  ENT: negative, no change in hearing  Eyes: negative, no change in  vision        Gynecology history:   Age of menarche:27 yo  Had IUD: occasional spotting a few times a year  Pregnancies: Denies  History of BCP's: x4 years   History of BSJ:GGEZMOQHU since age 75 yo  Hormone Use:N/A  History of STD's:No  Last pap:01/2022  History of Abnormal pap:No  Sexually active: Yes  Pain or bleeding with intercourse:No    Cancer Screening:  Mammogram: U/S 2 days ago following small right breast nodule   Colonoscopy: Denies  Pap test: no history of abnormals    Past Medical History:   Diagnosis Date    Anxiety     Family history of cancer     Major depressive disorder, single episode     Migraine      Past Surgical History:   Procedure Laterality Date    WISDOM TOOTH EXTRACTION  2017     Family History   Problem Relation Name Age of Onset    Breast Cancer Mother      Cancer Mother      Cancer M Grandfather      Other P Grandmother      Other P Grandfather      Hypertension P Grandfather      Heart Disease P Grandfather      Breast Cancer M Aunt  OB History   Gravida Para Term Preterm AB Living   0 0 0 0 0 0   SAB IAB Ectopic Multiple Live Births   0 0 0 0 0     Social History     Socioeconomic History    Marital status: Married   Tobacco Use    Smoking status: Never    Smokeless tobacco: Never   Substance and Sexual Activity    Alcohol use: Yes     Comment: socially    Drug use: Never    Sexual activity: Yes     Partners: Male     Birth control/protection: I.U.D.     Allergies   Allergen Reactions    Metoclopramide Other and Unspecified     Muscle spasms    Penicillins Hives and Rash    Prochlorperazine Other     Muscle spasms      Current Outpatient Medications on File Prior to Visit   Medication Sig Dispense Refill    cetirizine (ZYRTEC) 10 MG chewable tablet Take 1 tablet (10 mg) by mouth daily.      escitalopram (LEXAPRO) 10 MG tablet Take 1 tablet (10 mg) by mouth daily. 90 tablet 0     No current facility-administered medications on file prior to visit.       Review of systems: As per  HPI above.    Physical exam:  BP (!) 114/59 (BP Location: Left arm, BP Patient Position: Sitting, BP cuff size: Regular)   Pulse 64   Temp 97.1 F (36.2 C) (Temporal)   Resp 16   Ht 5\' 4"  (1.626 m)   Wt (!) 48.1 kg (106 lb)   SpO2 99%   BMI 18.19 kg/m   General: No acute distress, pleasant, cooperative  Neck: Supple, no lymphadenopathy   CV: Regular rate and rhythm, no murmurs, rubs or gallops  Resp: Clear to auscultation bilaterally   Abdomen: Soft, non-tender, non-distended, normal active bowel sounds   Extremity: No edema; no calf tenderness, erythema  Pelvic exam:   External: Normal appearing labia majora/minora   Speculum: normal mucosa, normal appearing discharge,  cervix - normal IUD visualized    Bimanual: normal uterine size, non-tender,  no adnexal masses    Assessment and Plan  [name] is a 27 year old female with a family history of cancer - no personal history of cancer who is being seen in clinic for consultation.      #.  Family history of cancer with negative genetic testing-   I had a long conversation with patient in clinic today. She was referred to the gynecology - oncology clinic as she has a maternal cousin who was diagnosed with low grade malignant potential tumor of the ovary at the age of 27 yo.Discussed with patient this is also known as borderline tumor of the ovary. This type of ovarian cancer is slow growing and is more common in young adults between ages of 44-40's. The etiology for these tumors are unknown.    These tumors are different from an epithelial ovarian cancer. Discussed with patient EOC rates increase with age. Median age being 27 yo. The general population has a 1.3% risk of developing ovarian cancer over their  lifetime. Discussed with patient if you have a first degree relative with ovarian cancer that risks increase to 5%, second degree relative 3.5% and 2 relatives with EOC rate increases to 7 %.    As such these patients are counseled to undergo risk  reducing  BSO starting 35-40 or when done childbearing. There is no true screening test for ovarian cancer. Discussed with patient as she has a second degree relative with borderline tumor of the ovary and the etiology of these tumors are unknown.  I'm not clear that she is at any increased risk. I do not have the results of patient's genetic testinfg available for today's visit.    I recommend the patient have a formal evaluation with cancer genetics as such referral was placed.  At this time we can do baseline pelvic ultrasound.    Once completed will call patient with the results.   Discussed suppression of ovulation can decrease her risk for developing an ovarian cancer.  Patient currently has a Mirena IUD in place.     #. Follow-up: pt moving to [place] (will resume care with provider in [place])      Orders Placed This Encounter   Procedures    US Pelvic Transabd/Transvag Combination      No orders of the defined types were placed in this encounter.     No orders of the defined types were placed in this encounter.       I personally spent 30 minutes in face-to-face and non-face-to-face activities related to the patient's visit today, excluding interpretation services and any separately reportable services/procedures.   APP Signature: Bertram Gala, MSN, ANP-BC

## 2022-03-15 NOTE — Progress Notes (Signed)
Reason for Visit  Chief Complaint   Patient presents with    Family History Of Cancer               History of Present Illness:     Debra Lee is a 27 year old female who is here for Family History Of Cancer (/)    She returns to clinic for high risk f/u for clinical breast check and discussion of her options for screening and prevention. She notes she and her husband are relocating to [place] in a few weeks for the [employer] so she wanted to make sure the recommendations were reviewed so she could continue her care plan with medical staff there. She notes she is meeting with gyn oncology tomorrow to review her high risk recommendations as well.    DX: Family history of breast cancer, benign mass in RIGHT breast, lifetime risk 29% (Myriad calculated 31%)  Genetics; Negative ( 2022 My[facility])      Family History of Breast Cancer:  Mother -87 - Invasive Ductal Carcinoma - ER+/Her 2+  Maternal Aunt - 44 - recurrence at 67 - ER+, recurrence was borderline triple negative         Patient Active Problem List   Diagnosis    Anxiety    MDD (major depressive disorder), recurrent episode, moderate (CMS-HCC)    Breast nodule    FHx: breast cancer in first degree relative    Migraine without status migrainosus, not intractable, unspecified migraine type         Past Medical History:   Diagnosis Date    Anxiety     Family history of cancer     Major depressive disorder, single episode     Migraine          Past Surgical History:   Procedure Laterality Date    WISDOM TOOTH EXTRACTION  2017         Allergies   Allergen Reactions    Metoclopramide Other and Unspecified     Muscle spasms    Penicillins Hives and Rash    Prochlorperazine Other     Muscle spasms          Current Outpatient Medications   Medication Sig Dispense Refill    cetirizine (ZYRTEC) 10 MG chewable tablet Take 1 tablet (10 mg) by mouth daily.      escitalopram (LEXAPRO) 10 MG tablet Take 1 tablet (10 mg) by mouth daily. 90 tablet 0     No current  facility-administered medications for this visit.         Social History     Socioeconomic History    Marital status: Married   Tobacco Use    Smoking status: Never    Smokeless tobacco: Never   Substance and Sexual Activity    Alcohol use: Yes     Comment: socially    Drug use: Never    Sexual activity: Yes     Partners: Male     Birth control/protection: I.U.D.     Social History     Tobacco Use   Smoking Status Never   Smokeless Tobacco Never     Social History     Substance and Sexual Activity   Alcohol Use Yes    Comment: socially     Social History     Substance and Sexual Activity   Drug Use Never         Family History   Problem Relation Name Age of Onset  Breast Cancer Mother      Cancer Mother      Cancer M Grandfather      Other P Grandmother      Other P Grandfather      Hypertension P Grandfather      Heart Disease P Grandfather      Breast Cancer M Aunt         Review of Systems    Pertinent items are noted in HPI.    Physical Exam  Patient is a 27 year old female who appeared alert, cooperative, no distress  BP 132/69 (BP Location: Left arm, BP Patient Position: Sitting, BP cuff size: Regular)   Pulse 81   Temp 98 F (36.7 C) (Temporal)   Resp 16   Ht 5\' 3"  (1.6 m)   SpO2 96%   BMI 19.59 kg/m   Body mass index is 19.59 kg/m.  General Appearance: healthy, alert, no distress, pleasant affect, cooperative.  Neck:  Neck supple. No adenopathy, thyroid symmetric, normal size.  Breast:  normal in size and symmetry, normal contour with no evidence of flattening or dimpling, skin normal, nipples everted without rashes or discharge, with diffuse fibrocystic dense breast tissue throughout. No noted lymphadenopathy.  Abdomen: Abdomen soft, non-tender. No masses or organomegaly. Bowel sounds normal.  Skin:  Skin color, texture, turgor normal. No rashes or lesions.    Medical Decision Making  Test Results:  HISTORY:  Patient is seen for follow-up at short-interval from prior study.     STUDIES  COMPARED:  Comparison is made with prior exams dating back to 08/08/2021.     Technologist: Cammy Copa     ULTRASOUND FINDINGS:  Sonography was performed in the right breast in transverse and longitudinal planes. There is a stable oval parallel mass with circumscribed margins measuring 9 x 3 x 5 mm in the right breast at 2 o'clock located 3 centimeters from the nipple. Internal echogenicity is hypoechoic.     IMPRESSION / RECOMMENDATION:  Mass in the right breast is probably benign. Follow-up in 6 months is recommended. Report from prior ultrasound indicates initial ultrasound in June of 2022. If images can be obtained for review at next follow up that would complete 2 years of documented stability.     ASSESSMENT:  BI-RADS Category 3:  Probably Benign      Impression:  [name]'s clinical exam is stable with no discrete changes noted.   Her breast US completed also noted the oval mass to be stable and probably benign and radiology notes she is recommended to have 6 month f/u of the site for 2 year stability which would be this year per reports.     As discussed at her last visit in depth, we reviewed her options for screening and prevention. She notes she would like to continue with high risk screening which would be annual breast MRI, clinical exams, and adding annual mammogram after age 71. This would be alternated every 6 months annually .    She will establish care with medical providers locally in [place] and she can return to our team as needed.     The risks, benefits and alternatives of the planned course of care have been discussed with the patient and/or her legal representative, all questions have been answered and they agree to proceed.

## 2022-03-16 ENCOUNTER — Encounter (INDEPENDENT_AMBULATORY_CARE_PROVIDER_SITE_OTHER): Payer: Self-pay | Admitting: Hospital

## 2022-03-16 ENCOUNTER — Encounter (INDEPENDENT_AMBULATORY_CARE_PROVIDER_SITE_OTHER): Payer: Self-pay | Admitting: Family Medicine

## 2022-03-16 ENCOUNTER — Telehealth: Admitting: Family Medicine

## 2022-03-16 NOTE — Telephone Encounter (Signed)
Forms completed during patient's PD appointment.

## 2022-03-16 NOTE — Telephone Encounter (Signed)
Secure chat sent to Dr.Damasco.

## 2022-03-16 NOTE — Telephone Encounter (Signed)
From: Wyona Almas  To: Rose Fillers Damasco-Gutierrez, MD  Sent: 03/16/2022 12:43 PM PST  Subject: Letter    Hello,     I saw the letter you uploaded, thank you for doing that. For the dates in the letter I was hoping it could be changed to just "May 2023" and "November 2023" for the start date and dose increase of the lexapro. They are being very picky about the 90 days from the dose change. I believe on some paperwork they already have it listed as a different date in November and do not want to cause any more confusion.    My husband and I will be by this afternoon to pick everything up. [place] and got more clarification on the [employer] form.     Thank you again!  Antanisha Ante

## 2022-03-16 NOTE — Progress Notes (Signed)
[facility] Tele-Medicine Note    Subjective   Debra Lee is a 27 year old female who presents to clinic via telephone. Patient was unable to access technology for video. due to COVID-19 pandemic and federally declared state of public health emergency. Patient's identity verified and consent obtained.    HPI 03/16/22 telephone visit  Cc:  Need letter to turn into [employer] about being stable on lexapro dosage and not being in need of a higher level of care for depression along with form.      Rx lexapro 10 mg #90 on 01/15/22 to exp 04/17/22.  Increased from 5 mg.  Initially rx 06/2021        07/18/2021    10:47 AM 07/24/2021    11:20 AM 10/04/2021     1:37 PM 01/16/2022    10:26 AM 03/16/2022     9:01 AM   GAD 7   1. Feeling nervous, anxious or on edge 1 1 0 1 1   2. Not being able to stop or control worrying 2 1 0 2 2   3. Worrying too much about different things 2 1 0 1 1   4. Trouble relaxing 0 0 0 0 0   5. Being so restless that it is hard to sit still 0 0 0 0 0   6. Being easily annoyed or irritable 1 1 0 1 1   7. Feeling afraid as if something awful might happen 0 0 0 0 0   GAD7 Patient Total 6 4 0 5 5   If you checked off any problems, how difficult have these problems made it for you to do your job along with other people? Somewhat difficult  Not difficult at all Somewhat difficult Somewhat difficult          07/18/2021    10:47 AM 07/24/2021    11:18 AM 10/04/2021     1:35 PM 01/15/2022     4:21 PM 01/16/2022    10:25 AM 03/16/2022     9:01 AM   [place] PHQ9 DEPRESSION QUESTIONNAIRE   Interest 1 1 0 1 1 0   Depressed 2 2 0 1 1 0   Sleep 2 1 1  0 0 0   Energy 1 2 0 0 0 0   Appetite 0 1 0 0 0 0   Failure 2 2 0 2 2 0   Concentration 0 0 0 0 0 0   Movement 0 0 0 0 0 0   Suicide 0 0 0 0 0 0   Summary(Calculated) 8 9 1 4 4  0   Functional Somewhat difficult -- Not difficult at all Somewhat difficult Somewhat difficult Not difficult at all    Previous therapist in the fall but when started work, did not continue.     To move to  [place] next week.    Just       REVIEW OF SYSTEMS      Review of Systems:    See HPI    Outpatient Medications Prior to Visit   Medication Sig Dispense Refill    cetirizine (ZYRTEC) 10 MG chewable tablet Take 1 tablet (10 mg) by mouth daily.      escitalopram (LEXAPRO) 10 MG tablet Take 1 tablet (10 mg) by mouth daily. 90 tablet 0     No facility-administered medications prior to visit.     Immunization History   Administered Date(s) Administered    (Chicken Pox) Varicella Vaccine 03/11/1996, 09/17/2006  COVID-19 (International Approved 2 Dose Only) 05/06/2019, 06/04/2019, 03/23/2020, 12/23/2020    COVID-19 (Moderna) Red Cap >= 12 Years 03/22/2020    HPV Unspecified Formulation 04/22/2010, 07/05/2010, 01/26/2011    Hep-A Vaccine, Unspecified 04/22/2010, 08/24/2013    Hepatitis B, Unspecified 01-31-96, 05/01/1995, 09/05/1995    Influenza Vaccine >=6 Months 12/13/2021    MMR 03/11/1996, 07/18/2000    Meningococcal MCV4P 04/22/2010, 07/16/2012    Polio, Unspecified 05/01/1995, 07/02/1995, 03/11/1996, 07/18/2000    Tdap 05/20/2017, 12/13/2021    Tetanus/Diptheria Vaccine 09/17/2006     Allergies   Allergen Reactions    Metoclopramide Other and Unspecified     Muscle spasms    Penicillins Hives and Rash    Prochlorperazine Other     Muscle spasms      Patient Active Problem List    Diagnosis Date Noted    Breast nodule 08/08/2021     Had ultrasound 07/2020 2 nodules at right breast.  5 oclock and 10 oclock      FHx: breast cancer in first degree relative 08/08/2021    Migraine without status migrainosus, not intractable, unspecified migraine type 08/08/2021    Anxiety 07/24/2021    MDD (major depressive disorder), recurrent episode, moderate (CMS-HCC) 07/24/2021     Past Medical History:   Diagnosis Date    Anxiety     Family history of cancer     Major depressive disorder, single episode     Migraine      Past Surgical History:   Procedure Laterality Date    WISDOM TOOTH EXTRACTION  2017     Social History      Socioeconomic History    Marital status: Married     Spouse name: Not on file    Number of children: Not on file    Years of education: Not on file    Highest education level: Not on file   Occupational History    Not on file   Tobacco Use    Smoking status: Never    Smokeless tobacco: Never   Substance and Sexual Activity    Alcohol use: Yes     Comment: socially    Drug use: Never    Sexual activity: Yes     Partners: Male     Birth control/protection: I.U.D.   Other Topics Concern    Not on file   Social History Narrative    Not on file     Social Determinants of Health     Financial Resource Strain: Not on file   Food Insecurity: Not on file   Transportation Needs: Not on file   Physical Activity: Not on file   Stress: Not on file   Social Connections: Not on file   Intimate Partner Violence: Not on file   Housing Stability: Not on file     Family History   Problem Relation Name Age of Onset    Breast Cancer Mother      Cancer Mother      Cancer M Grandfather      Other P Grandmother      Other P Grandfather      Hypertension P Grandfather      Heart Disease P Grandfather      Breast Cancer M Aunt       Family Status   Relation Status    Mo (Not Specified)    MGFa (Not Specified)    PGMo (Not Specified)    PGFa (Not Specified)    MAunt (Not  Specified)     Objective:  There were no vitals filed for this visit.  There is no height or weight on file to calculate BMI.    Wt Readings from Last 5 Encounters:   03/15/22 (!) 48.1 kg (106 lb)   01/29/22 50.2 kg (110 lb 9.6 oz)   11/14/21 (!) 46.9 kg (103 lb 6.4 oz)   08/08/21 (!) 44.9 kg (99 lb)   07/18/21 (!) 45.6 kg (100 lb 9.6 oz)     Blood Pressure   03/15/22 (!) 114/59   03/13/22 132/69   01/29/22 119/72   11/14/21 121/75   08/08/21 118/77       PHYSICAL EXAMINATION      Physical Exam    GEN: The patient is well developed and well nourished, ambulatory, in no acute distress, not ill-appearing.  HEENT: normocephalic/atraumatic, anicteric sclera, there is no  evidence for cervical or supraclavicular lymphadenopathy bilaterally.  LUNGS: No cough noted throughout the interview. No dyspnea while talking and able to speak in full sentences. No nasal flaring, audible wheezing, or signs of respiratory distress.  EXTREMITIES: No edema of the lower extremities.  SKIN: No petechiae or rash of observed skin  NEURO: Alert & oriented x 3. No focal deficits noted.  PSYCHIATRIC: Good insight. Appropriate mood and affect    Labs:  Results for orders placed or performed in visit on 01/29/22   Pap IG, rfx HPV all pth   Result Value Ref Range    DIAGNOSIS: Comment     Specimen adequacy: Comment     Clinician provided ICD10: Comment     Performed by: Comment     PAP .1 .     Note Comment     Test Methodology: Comment     PAP .2 Comment      Imaging:  US Breast Limited - Right    Result Date: 03/13/2022  HISTORY:  Patient is seen for follow-up at short-interval from prior study.    STUDIES COMPARED:  Comparison is made with prior exams dating back to 08/08/2021.    Technologist: Cammy Copa    ULTRASOUND FINDINGS:  Sonography was performed in the right breast in transverse and longitudinal planes. There is a stable oval parallel mass with circumscribed margins measuring 9 x 3 x 5 mm in the right breast at 2 o'clock located 3 centimeters from the nipple. Internal echogenicity is hypoechoic.    IMPRESSION / RECOMMENDATION:  Mass in the right breast is probably benign. Follow-up in 6 months is recommended. Report from prior ultrasound indicates initial ultrasound in June of 2022. If images can be obtained for review at next follow up that would complete 2 years of documented stability.    ASSESSMENT:  BI-RADS Category 3:  Probably Benign          ASSESSMENT AND PLAN        Mild depression        Patient Instructions   Come in to sign form and pick up letter.    Set up mail order pharmacy.    Request refill to be sent to mail order pharmacy 2 weeks before running out of medication.          Health Maintenance   Topic Date Due    Universal HIV Screening  Never done    COVID-19 Vaccine (6 - 2023-24 season) 10/20/2021    PHQ9 Depression Monitoring doc flowsheet  07/15/2022    Cervical Cancer Screening  01/30/2025    Lipid Screening  07/19/2026    Tetanus (4 - Td or Tdap) 12/14/2031    Shingles Vaccine (1 of 2) 03/07/2045    Polio Vaccine  Completed    IMM_Hep A Vaccine Series  Completed    HPV Vaccine <= 26 Yrs  Completed    Hepatitis C Screening  Completed    Influenza  Completed    Meningococcal MCV4 Vaccine  Completed    Pneumococcal Vaccine  Aged Out       FOLLOW UP     Return in 2 weeks (on 03/30/2022), or if symptoms worsen or fail to improve, for to request med refill. .    Future Appointments   Date Time Provider Department Center   03/16/2022  9:00 AM Damasco-Gutierrez, Laurell Roof, MD [facility]       Serafina Royals, MD

## 2022-03-16 NOTE — Patient Instructions (Addendum)
Come in to sign form and pick up letter.    Set up mail order pharmacy.    Request refill to be sent to mail order pharmacy 2 weeks before running out of medication.

## 2022-04-05 IMAGING — MR MRI BRAIN W WO CONTRAST
6 of 11 series · 26 of 48 positions shown · IV contrast (prohance)
Comparison: None.

HISTORY: 26-year-old female with migraine. Patient reports bilateral hand numbness.
TECHNIQUE: Multiplanar, multisequence MRI images of the brain are obtained prior to and following intravenous contrast.

CONTRAST: 15 mL of ProHance

[Series 5: 3d_flair_sag · sagittal · 1.0mm · 0.47mm/px · 7 of 160 slices shown]
[im 1/160]
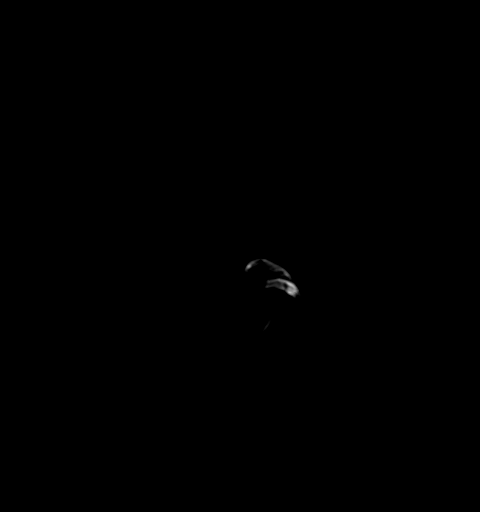
[im 27/160]
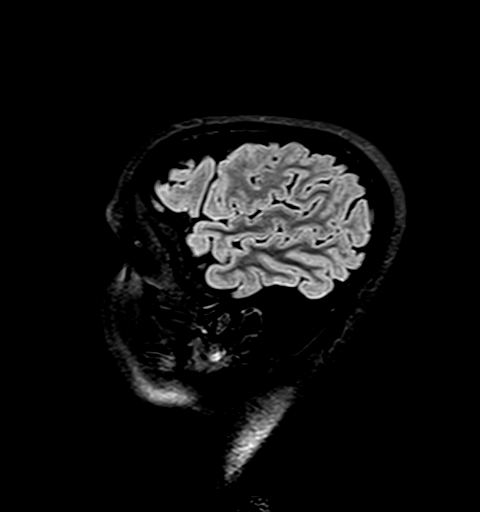
[im 54/160]
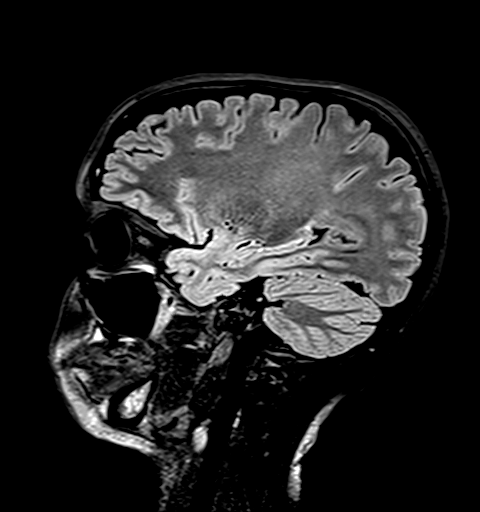
[im 80/160]
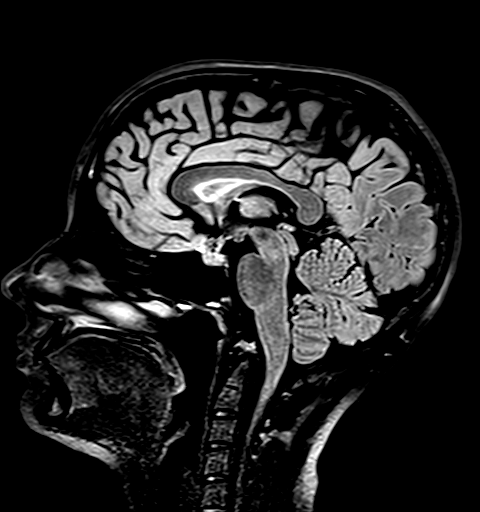
[im 107/160]
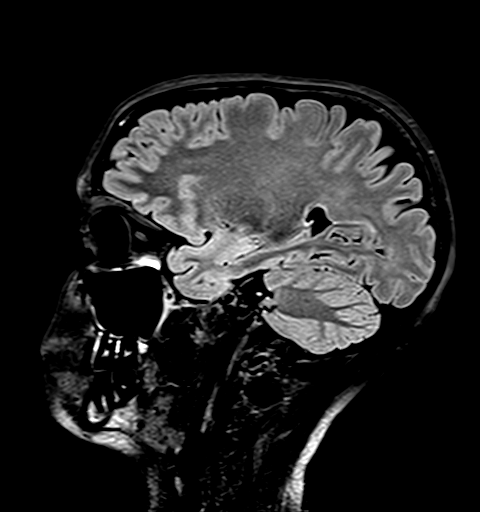
[im 133/160]
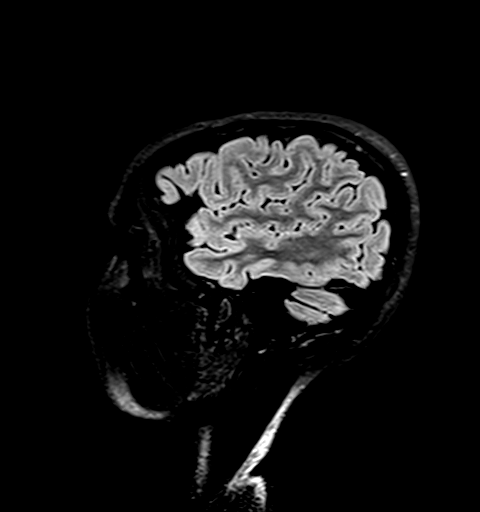
[im 160/160]
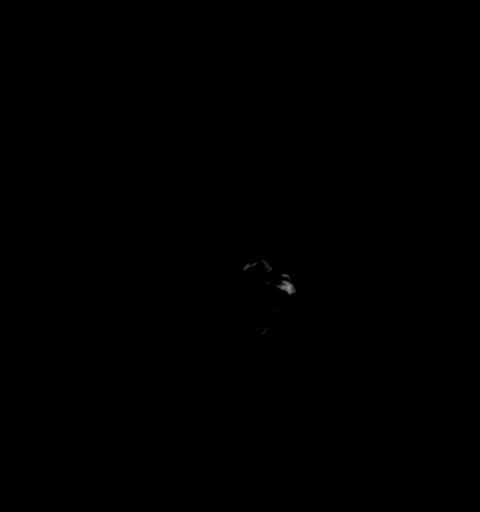

[Series 6: 3d_flair_sag_mpr_cor · coronal · 1.0mm · 0.47mm/px · 8 of 200 slices shown]
[im 1/200]
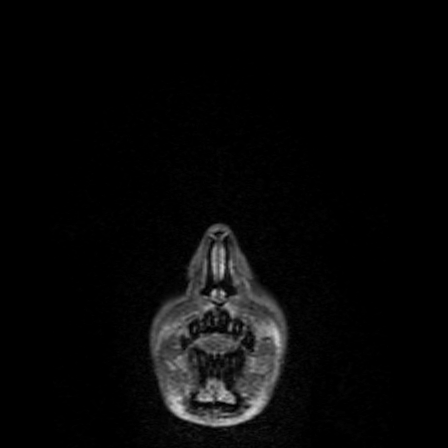
[im 25/200]
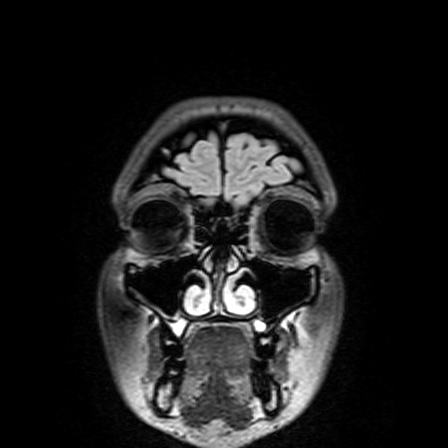
[im 50/200]
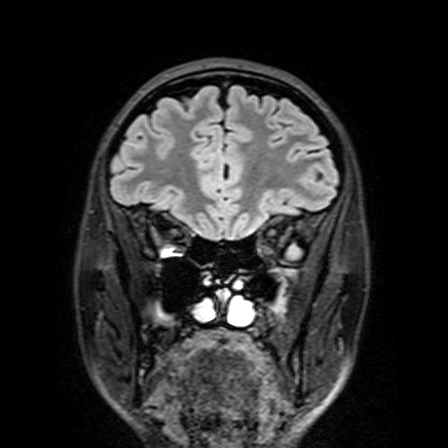
[im 75/200]
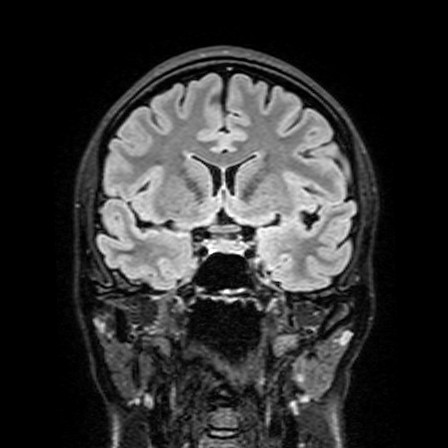
[im 125/200]
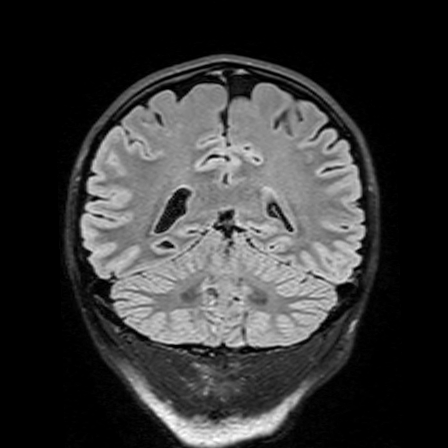
[im 150/200]
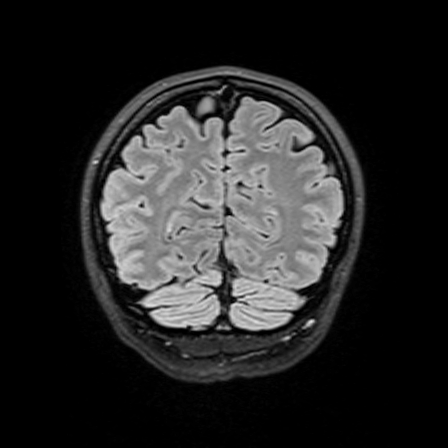
[im 175/200]
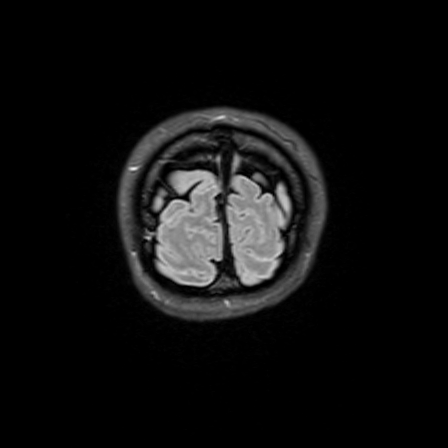
[im 200/200]
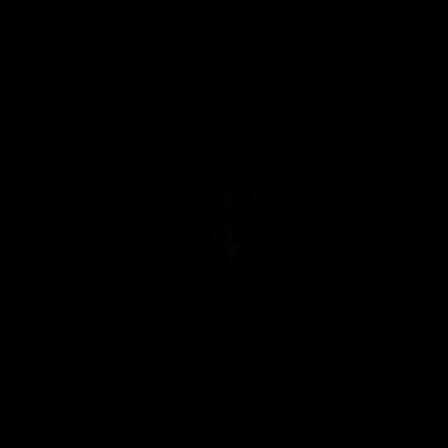

[Series 7: 3d_flair_sag_mpr_axial · axial · 1.0mm · 0.47mm/px · z∈[-96,+13]mm · 5 of 200 slices shown]
[im 1/200]
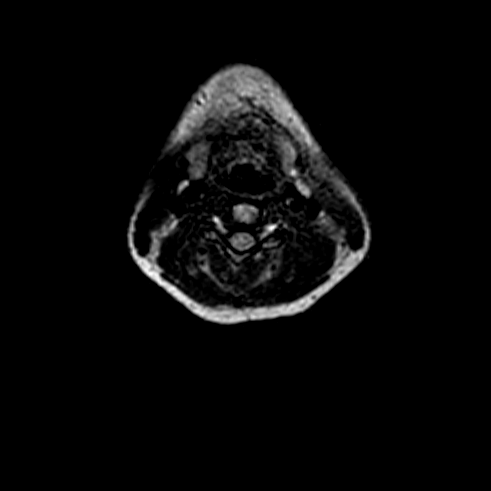
[im 29/200]
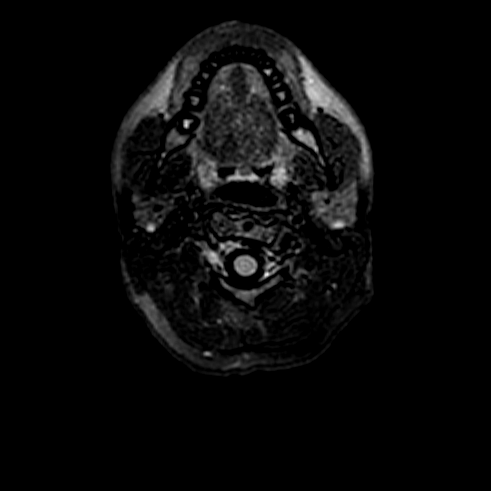
[im 57/200]
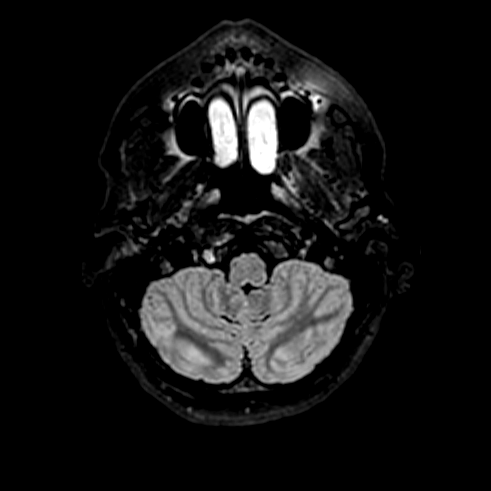
[im 86/200]
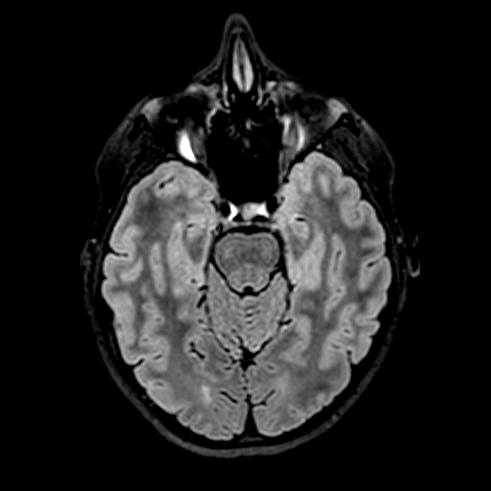
[im 114/200]
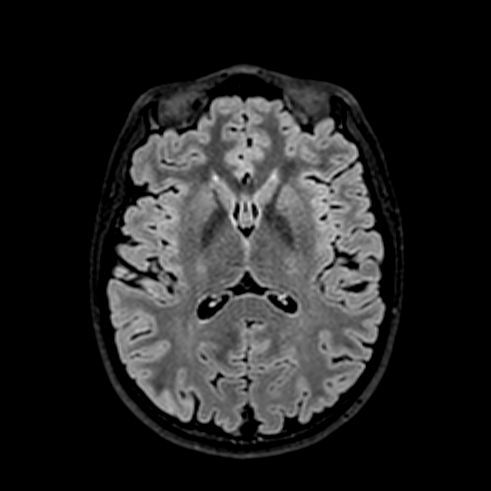

[Series 10: DWI · axial · 4.0mm · 0.86mm/px · 1 of 30 slices shown (1 of 2)]
[im 1/30]
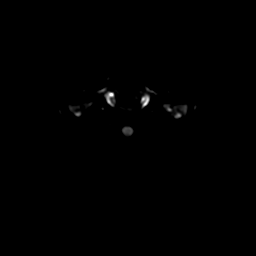

[Series 11: DWI · axial · 4.0mm · 0.86mm/px · 1 of 30 slices shown (2 of 2)]
[im 1/30]
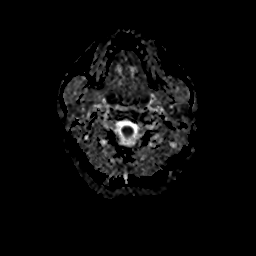

[Series 101: T1 fat-sat post-contrast · axial · 1.0mm · 0.48mm/px · z∈[-20,+174]mm · 4 of 104 slices shown]
[im 1/104]
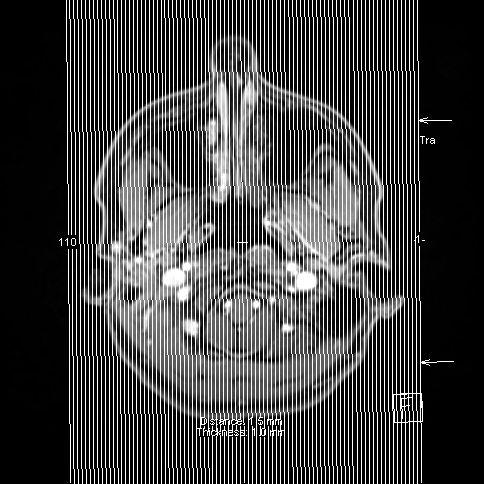
[im 35/104]
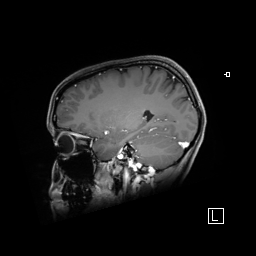
[im 69/104]
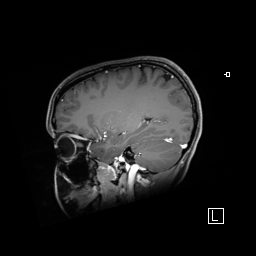
[im 104/104]
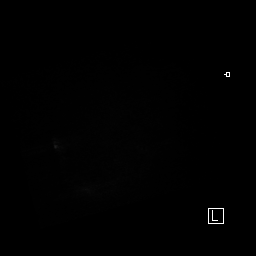

[26 of 48 positions shown; findings below may reference images not displayed]

FINDINGS: BRAIN PARENCHYMA: No acute infarct. No abnormal intracranial signal or susceptibility.

PITUITARY: Unremarkable.

VASCULATURE: Expected major intracranial flow voids are present.

VENTRICULAR SYSTEM: No hydrocephalus or midline shift.

EXTRA-AXIAL SPACES: No intra- or extra-axial fluid collections.

ORBITS: Unremarkable.

PARANASAL SINUSES AND MASTOID AIR CELLS: Predominantly clear.

BONES: Unremarkable.
IMPRESSION: No acute intracranial abnormality. No white matter lesions.

## 2022-04-09 ENCOUNTER — Other Ambulatory Visit (INDEPENDENT_AMBULATORY_CARE_PROVIDER_SITE_OTHER): Payer: Self-pay | Admitting: Family Medicine

## 2022-04-09 DIAGNOSIS — F32A Depression, unspecified: Secondary | ICD-10-CM

## 2022-04-12 ENCOUNTER — Encounter (INDEPENDENT_AMBULATORY_CARE_PROVIDER_SITE_OTHER): Payer: Self-pay | Admitting: Hospital

## 2022-04-12 ENCOUNTER — Telehealth: Admitting: Family Medicine

## 2022-04-12 ENCOUNTER — Encounter (INDEPENDENT_AMBULATORY_CARE_PROVIDER_SITE_OTHER): Payer: Self-pay | Admitting: Family Medicine

## 2022-04-12 MED ORDER — ESCITALOPRAM OXALATE 10 MG OR TABS
10.00 mg | ORAL_TABLET | Freq: Every day | ORAL | 0 refills | Status: AC
Start: 2022-04-12 — End: ?

## 2022-04-12 NOTE — Progress Notes (Signed)
[facility] Tele-Medicine Note    Subjective   Debra Lee is a 27 year old female who presents to clinic via telephone. Patient was unable to access technology for video. due to COVID-19 pandemic and federally declared state of public health emergency. Patient's identity verified and consent obtained.    HPI 04/12/22 video visit  Cc:  Updated paperwork stating I am stable on my 10 mg of Lexapro after having the dose changed 90 days ago.    Lexapro dose increased to '10mg'$  on 01/15/22    LMP spotting on Mirena 2 to 3 x yr.  Placed 11/2018.    GAD/PHQ         07/18/2021    10:47 AM 07/24/2021    11:20 AM 10/04/2021     1:37 PM 01/16/2022    10:26 AM 03/16/2022     9:01 AM 04/12/2022     1:40 PM   GAD 7   1. Feeling nervous, anxious or on edge 1 1 0 1 1 0   2. Not being able to stop or control worrying 2 1 0 2 2 0   3. Worrying too much about different things 2 1 0 '1 1 1   '$ 4. Trouble relaxing 0 0 0 0 0 0   5. Being so restless that it is hard to sit still 0 0 0 0 0 0   6. Being easily annoyed or irritable 1 1 0 1 1 0   7. Feeling afraid as if something awful might happen 0 0 0 0 0 0   GAD7 Patient Total 6 4 0 '5 5 1   '$ If you checked off any problems, how difficult have these problems made it for you to do your job along with other people? Somewhat difficult  Not difficult at all Somewhat difficult Somewhat difficult           07/24/2021    11:18 AM 10/04/2021     1:35 PM 01/15/2022     4:21 PM 01/16/2022    10:25 AM 03/16/2022     9:01 AM 04/12/2022     8:17 AM 04/12/2022     1:39 PM   [place] PHQ9 DEPRESSION QUESTIONNAIRE   Interest 1 0 1 1 0 0 0   Depressed 2 0 1 1 0 0 0   Sleep 1 1 0 0 0 0 0   Energy 2 0 0 0 0 0 0   Appetite 1 0 0 0 0 0 0   Failure 2 0 2 2 0 1 1   Concentration 0 0 0 0 0 0 0   Movement 0 0 0 0 0 0 0   Suicide 0 0 0 0 0 0 0   Summary(Calculated) '9 1 4 4 '$ 0 1 1   Functional -- Not difficult at all Somewhat difficult Somewhat difficult Not difficult at all Not difficult at all Not difficult at all             REVIEW  OF SYSTEMS      Review of Systems:    See HPI    Outpatient Medications Prior to Visit   Medication Sig Dispense Refill    cetirizine (ZYRTEC) 10 MG chewable tablet Take 1 tablet (10 mg) by mouth daily.      escitalopram (LEXAPRO) 10 MG tablet Take 1 tablet (10 mg) by mouth daily. 90 tablet 0     No facility-administered medications prior to visit.     Immunization History  Administered Date(s) Administered    (Chicken Pox) Varicella Vaccine 03/11/1996, 09/17/2006    COVID-19 (International Approved 2 Dose Only) 05/06/2019, 06/04/2019, 03/23/2020, 12/23/2020    COVID-19 (Moderna) Red Cap >= 12 Years 03/22/2020    HPV Unspecified Formulation 04/22/2010, 07/05/2010, 01/26/2011    Hep-A Vaccine, Unspecified 04/22/2010, 08/24/2013    Hepatitis B, Unspecified Dec 09, 1995, 05/01/1995, 09/05/1995    Influenza Vaccine >=6 Months 12/13/2021    MMR 03/11/1996, 07/18/2000    Meningococcal MCV4P 04/22/2010, 07/16/2012    Polio, Unspecified 05/01/1995, 07/02/1995, 03/11/1996, 07/18/2000    Tdap 05/20/2017, 12/13/2021    Tetanus/Diptheria Vaccine 09/17/2006     Allergies   Allergen Reactions    Metoclopramide Other and Unspecified     Muscle spasms    Penicillins Hives and Rash    Prochlorperazine Other     Muscle spasms      Patient Active Problem List    Diagnosis Date Noted    Breast nodule 08/08/2021     Had ultrasound 07/2020 2 nodules at right breast.  5 oclock and 10 oclock      FHx: breast cancer in first degree relative 08/08/2021    Migraine without status migrainosus, not intractable, unspecified migraine type 08/08/2021    Anxiety 07/24/2021    MDD (major depressive disorder), recurrent episode, moderate (CMS-HCC) 07/24/2021     Past Medical History:   Diagnosis Date    Anxiety     Family history of cancer     Major depressive disorder, single episode     Migraine      Past Surgical History:   Procedure Laterality Date    WISDOM TOOTH EXTRACTION  2017     Social History     Socioeconomic History    Marital status:  Married     Spouse name: Not on file    Number of children: Not on file    Years of education: Not on file    Highest education level: Not on file   Occupational History    Not on file   Tobacco Use    Smoking status: Never    Smokeless tobacco: Never   Substance and Sexual Activity    Alcohol use: Yes     Comment: socially    Drug use: Never    Sexual activity: Yes     Partners: Male     Birth control/protection: I.U.D.   Other Topics Concern    Not on file   Social History Narrative    Not on file     Social Determinants of Health     Financial Resource Strain: Not on file   Food Insecurity: Not on file   Transportation Needs: Not on file   Physical Activity: Not on file   Stress: Not on file   Social Connections: Not on file   Intimate Partner Violence: Not on file   Housing Stability: Not on file     Family History   Problem Relation Name Age of Onset    Breast Cancer Mother      Cancer Mother      Cancer M Grandfather      Other P Grandmother      Other P Grandfather      Hypertension P Grandfather      Heart Disease P Grandfather      Breast Cancer M Aunt       Family Status   Relation Status    Mo (Not Specified)    MGFa (Not Specified)  PGMo (Not Specified)    PGFa (Not Specified)    MAunt (Not Specified)     Objective:  There were no vitals filed for this visit.  There is no height or weight on file to calculate BMI.    Wt Readings from Last 5 Encounters:   03/15/22 (!) 48.1 kg (106 lb)   01/29/22 50.2 kg (110 lb 9.6 oz)   11/14/21 (!) 46.9 kg (103 lb 6.4 oz)   08/08/21 (!) 44.9 kg (99 lb)   07/18/21 (!) 45.6 kg (100 lb 9.6 oz)     Blood Pressure   03/15/22 (!) 114/59   03/13/22 132/69   01/29/22 119/72   11/14/21 121/75   08/08/21 118/77       PHYSICAL EXAMINATION      Physical Exam    Exam limited by telephone encounter  LUNGS: No cough noted throughout the interview. No dyspnea while talking and able to speak in full sentences.   NEURO: Alert & oriented x 3.   PSYCHIATRIC: Good insight.       Labs:  Results for orders placed or performed in visit on 01/29/22   Pap IG, rfx HPV all pth   Result Value Ref Range    DIAGNOSIS: Comment     Specimen adequacy: Comment     Clinician provided ICD10: Comment     Performed by: Comment     PAP .1 .     Note Comment     Test Methodology: Comment     PAP .2 Comment      Imaging:  US Breast Limited - Right    Result Date: 03/13/2022  HISTORY:  Patient is seen for follow-up at short-interval from prior study.    STUDIES COMPARED:  Comparison is made with prior exams dating back to 08/08/2021.    Technologist: Cammy Copa    ULTRASOUND FINDINGS:  Sonography was performed in the right breast in transverse and longitudinal planes. There is a stable oval parallel mass with circumscribed margins measuring 9 x 3 x 5 mm in the right breast at 2 o'clock located 3 centimeters from the nipple. Internal echogenicity is hypoechoic.    IMPRESSION / RECOMMENDATION:  Mass in the right breast is probably benign. Follow-up in 6 months is recommended. Report from prior ultrasound indicates initial ultrasound in June of 2022. If images can be obtained for review at next follow up that would complete 2 years of documented stability.    ASSESSMENT:  BI-RADS Category 3:  Probably Benign          ASSESSMENT AND PLAN        Depression, unspecified depression type        Patient Instructions   Fill out questionnaires.  Letter to be written and sent to [place].  Prescription for Lexapro sent to [facility] on [address].  Leave message if it needs to be sent to different pharmacy     Health Maintenance   Topic Date Due    Universal HIV Screening  Never done    COVID-19 Vaccine (6 - 2023-24 season) 10/20/2021    PHQ9 Depression Monitoring doc flowsheet  07/15/2022    Cervical Cancer Screening  01/30/2025    Lipid Screening  07/19/2026    Tetanus (4 - Td or Tdap) 12/14/2031    Shingles Vaccine (1 of 2) 03/07/2045    Polio Vaccine  Completed    IMM_Hep A Vaccine Series  Completed    HPV Vaccine <= 26  Yrs  Completed  Hepatitis C Screening  Completed    Influenza  Completed    Meningococcal MCV4 Vaccine  Completed    Pneumococcal Vaccine  Aged Out       FOLLOW UP     No follow-ups on file.    Future Appointments   Date Time Provider [place]   04/12/2022  8:00 AM Damasco-Gutierrez, Rose Fillers, MD [facility]       Maurilio Lovely, MD

## 2022-04-12 NOTE — Patient Instructions (Signed)
Fill out questionnaires.  Letter to be written and sent to [place].  Prescription for Lexapro sent to [facility] on [address].  Leave message if it needs to be sent to different pharmacy

## 2022-04-13 NOTE — Telephone Encounter (Signed)
Routing to Dr. Vickie Epley
